# Patient Record
Sex: Female | Born: 1985 | Race: Black or African American | Hispanic: No | Marital: Single | Smoking: Former smoker
Health system: Southern US, Community
[De-identification: ages and names within clinical notes are randomized; demographics above are authoritative.]

## PROBLEM LIST (undated history)

## (undated) ENCOUNTER — Inpatient Hospital Stay (HOSPITAL_COMMUNITY): Payer: Self-pay

## (undated) DIAGNOSIS — N760 Acute vaginitis: Secondary | ICD-10-CM

## (undated) DIAGNOSIS — B999 Unspecified infectious disease: Secondary | ICD-10-CM

## (undated) DIAGNOSIS — N390 Urinary tract infection, site not specified: Secondary | ICD-10-CM

## (undated) DIAGNOSIS — A749 Chlamydial infection, unspecified: Secondary | ICD-10-CM

## (undated) DIAGNOSIS — B9689 Other specified bacterial agents as the cause of diseases classified elsewhere: Secondary | ICD-10-CM

## (undated) HISTORY — DX: Unspecified infectious disease: B99.9

## (undated) HISTORY — PX: INDUCED ABORTION: SHX677

---
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
## 2005-10-13 ENCOUNTER — Emergency Department (HOSPITAL_COMMUNITY): Admission: EM | Admit: 2005-10-13 | Discharge: 2005-10-13 | Payer: Self-pay | Admitting: Emergency Medicine

## 2007-02-10 ENCOUNTER — Inpatient Hospital Stay (HOSPITAL_COMMUNITY): Admission: AD | Admit: 2007-02-10 | Discharge: 2007-02-10 | Payer: Self-pay | Admitting: Gynecology

## 2007-02-26 ENCOUNTER — Inpatient Hospital Stay (HOSPITAL_COMMUNITY): Admission: AD | Admit: 2007-02-26 | Discharge: 2007-02-26 | Payer: Self-pay | Admitting: Obstetrics & Gynecology

## 2007-03-22 ENCOUNTER — Ambulatory Visit (HOSPITAL_COMMUNITY): Admission: RE | Admit: 2007-03-22 | Discharge: 2007-03-22 | Payer: Self-pay | Admitting: Family Medicine

## 2007-04-19 ENCOUNTER — Ambulatory Visit (HOSPITAL_COMMUNITY): Admission: RE | Admit: 2007-04-19 | Discharge: 2007-04-19 | Payer: Self-pay | Admitting: Family Medicine

## 2007-05-09 ENCOUNTER — Ambulatory Visit (HOSPITAL_COMMUNITY): Admission: RE | Admit: 2007-05-09 | Discharge: 2007-05-09 | Payer: Self-pay | Admitting: Obstetrics & Gynecology

## 2007-06-03 ENCOUNTER — Inpatient Hospital Stay (HOSPITAL_COMMUNITY): Admission: AD | Admit: 2007-06-03 | Discharge: 2007-06-04 | Payer: Self-pay | Admitting: Obstetrics

## 2007-08-13 ENCOUNTER — Inpatient Hospital Stay (HOSPITAL_COMMUNITY): Admission: AD | Admit: 2007-08-13 | Discharge: 2007-08-13 | Payer: Self-pay | Admitting: Obstetrics & Gynecology

## 2007-09-07 ENCOUNTER — Ambulatory Visit (HOSPITAL_COMMUNITY): Admission: RE | Admit: 2007-09-07 | Discharge: 2007-09-07 | Payer: Self-pay | Admitting: Obstetrics & Gynecology

## 2007-09-18 ENCOUNTER — Inpatient Hospital Stay (HOSPITAL_COMMUNITY): Admission: RE | Admit: 2007-09-18 | Discharge: 2007-09-20 | Payer: Self-pay | Admitting: Obstetrics & Gynecology

## 2007-11-21 IMAGING — US US OB NUCHAL TRANSLUCENCY 1ST GEST
1 series · 14 of 23 positions shown · non-contrast
Comparison: none

OBSTETRICAL ULTRASOUND:
 This ultrasound was performed in The [HOSPITAL], and the AS OB/GYN report will be stored to [REDACTED] PACS.

[Series 1: us ob nuchal translucency 1st gest · 14 of 23 slices shown]
[im 1/23]
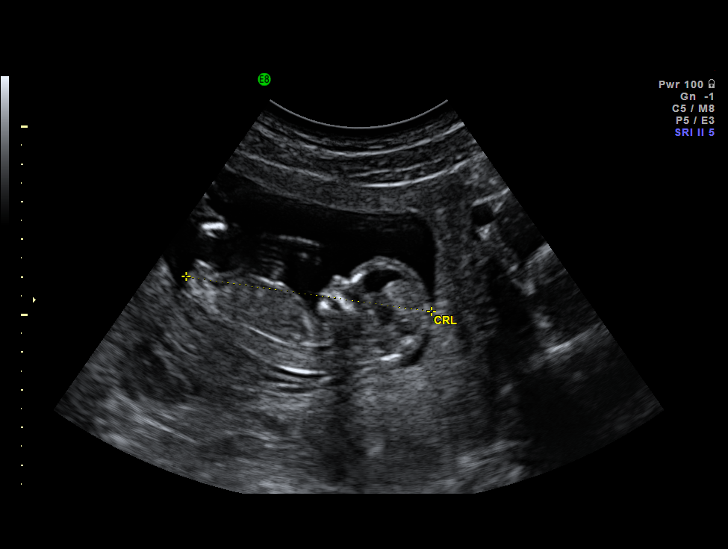
[im 3/23]
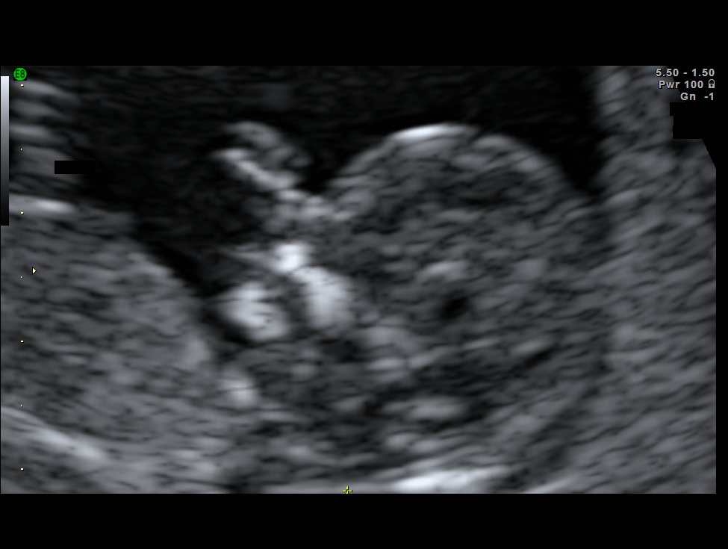
[im 5/23]
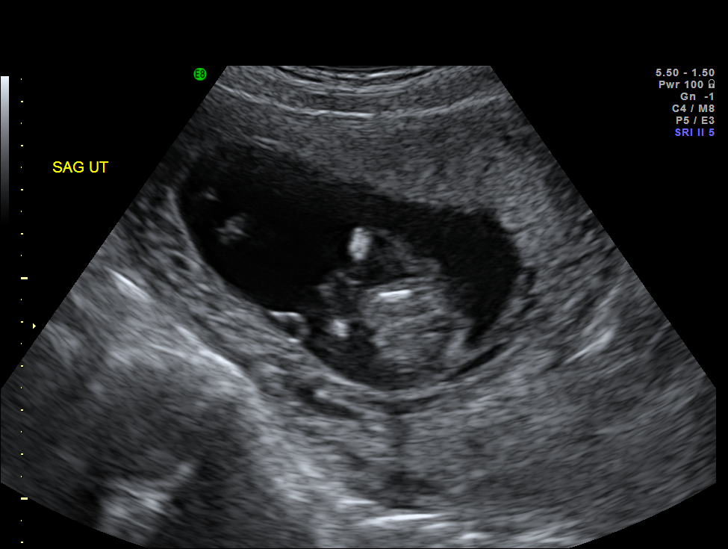
[im 6/23]
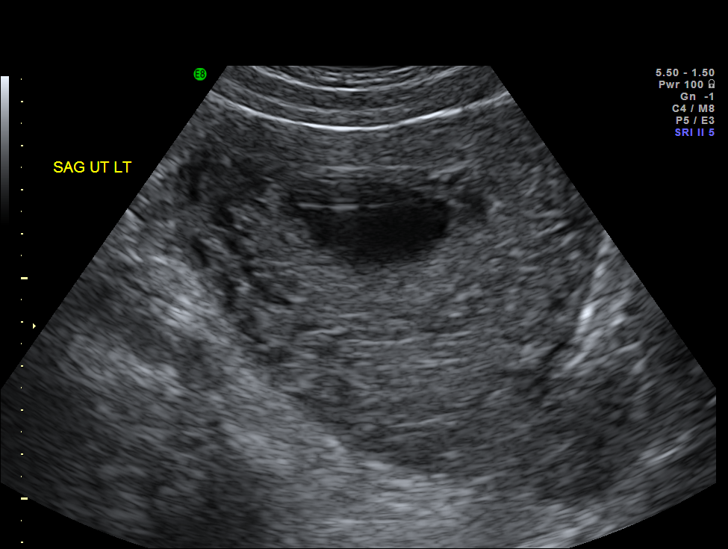
[im 8/23]
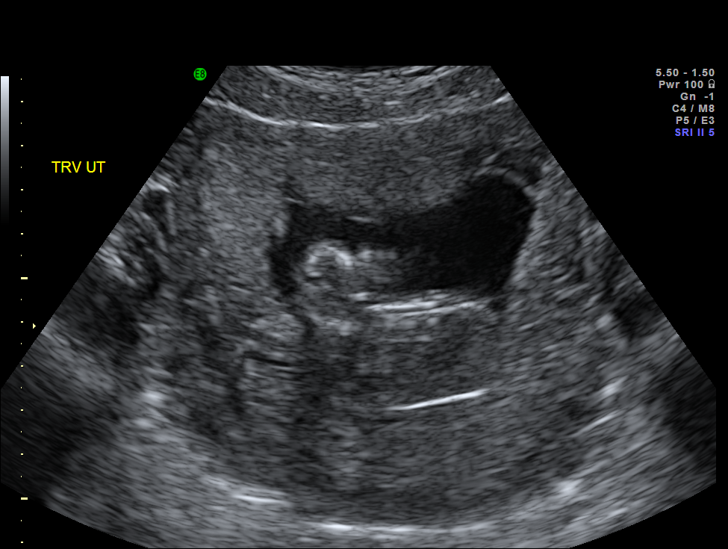
[im 10/23]
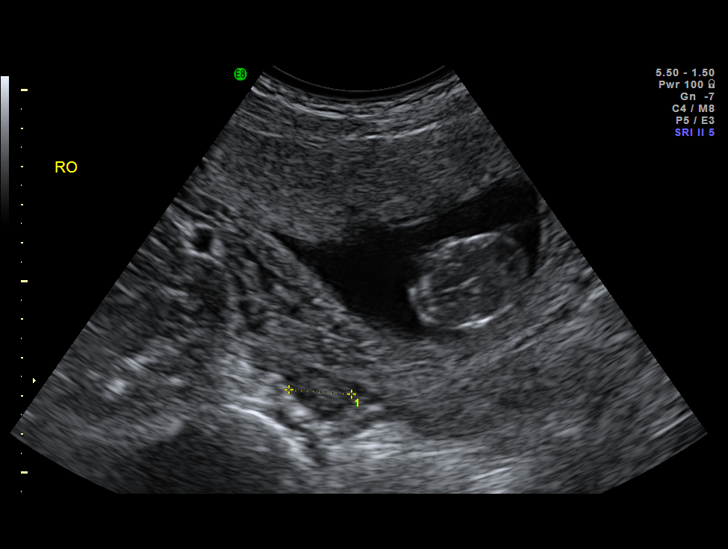
[im 11/23]
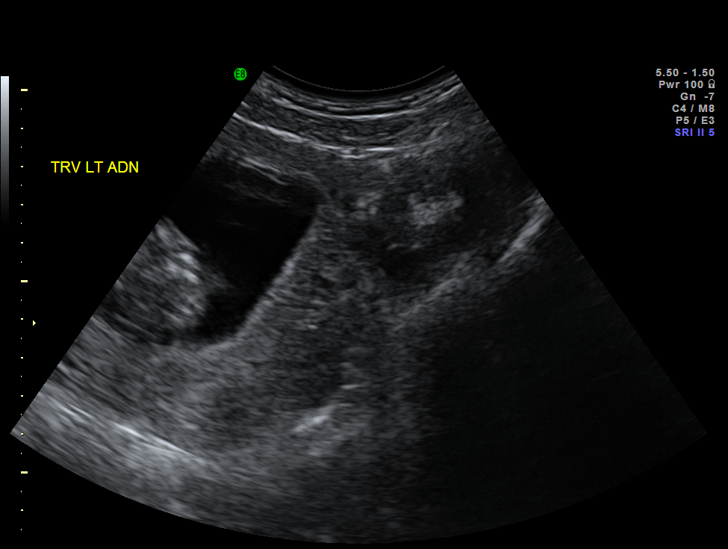
[im 13/23]
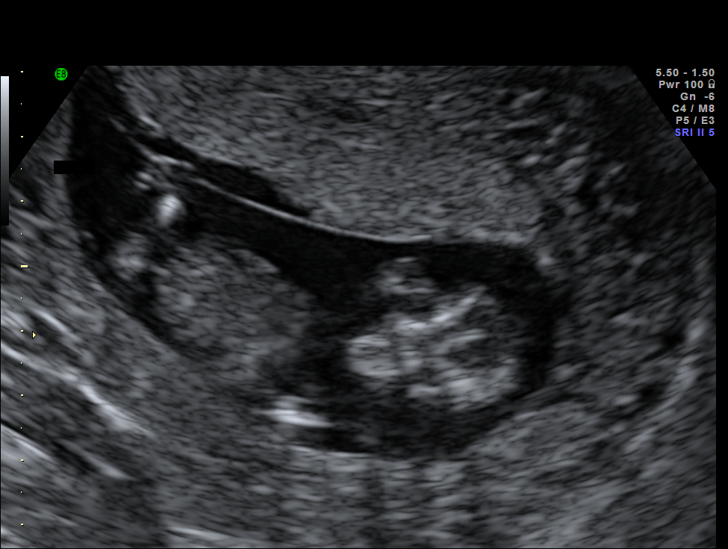
[im 14/23]
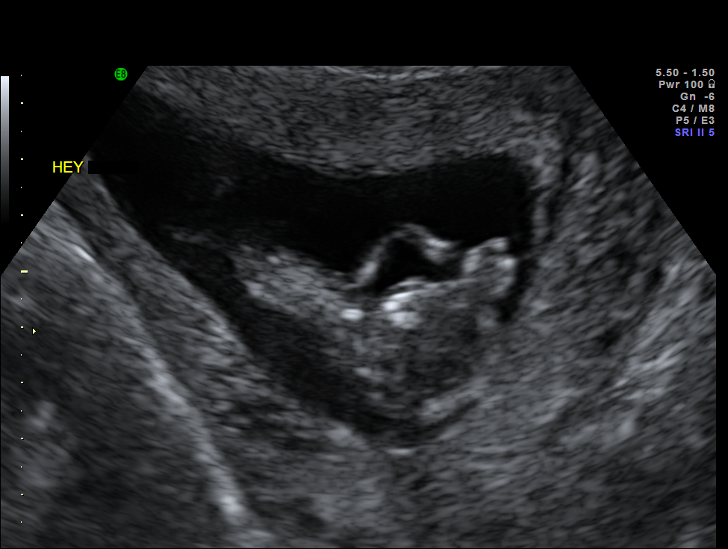
[im 16/23]
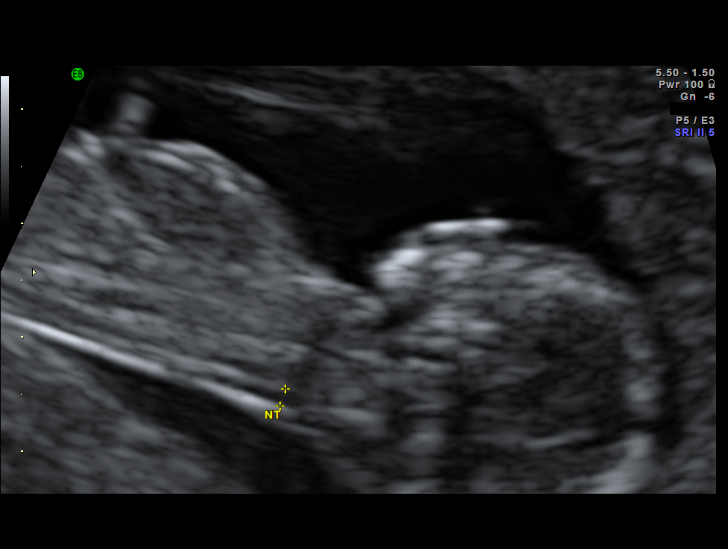
[im 18/23]
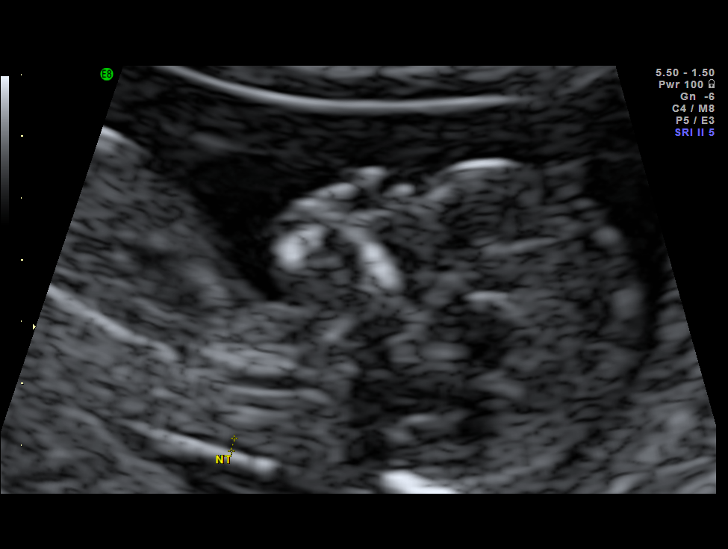
[im 19/23]
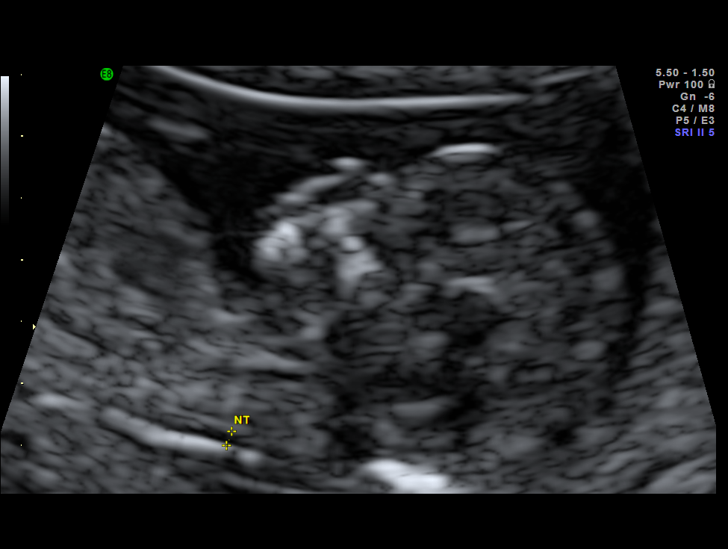
[im 21/23]
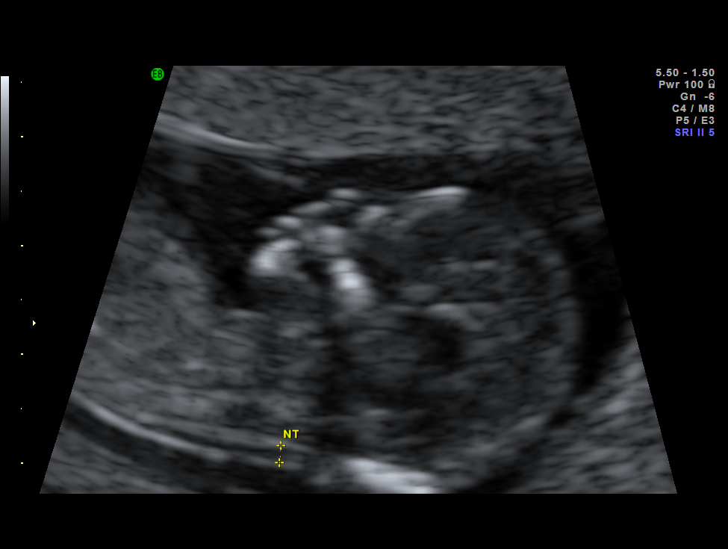
[im 23/23]
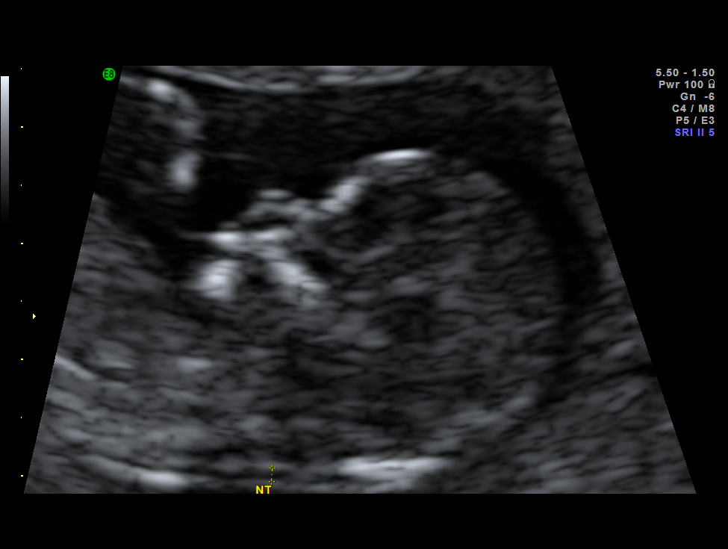

[14 of 23 positions shown; findings below may reference images not displayed]

IMPRESSION: The AS OB/GYN report has also been faxed to the ordering physician.

## 2008-05-08 IMAGING — US US FETAL BPP W/O NONSTRESS
1 series · 14 of 28 positions shown · non-contrast
Comparison: none

OBSTETRICAL ULTRASOUND:

 This ultrasound exam was performed in the [HOSPITAL] Ultrasound Department.  The OB US report was generated in the AS system, and faxed to the ordering physician.  This report is also available in [REDACTED] PACS.

[Series 1: us fetal bpp w/o nonstress · non-contrast · 29 acquisitions, 14 frames shown]
[im 2/29]
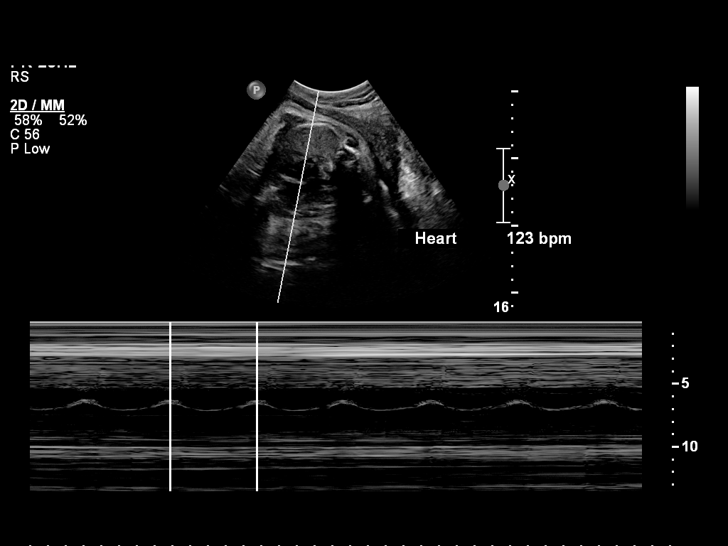
[im 4/29]
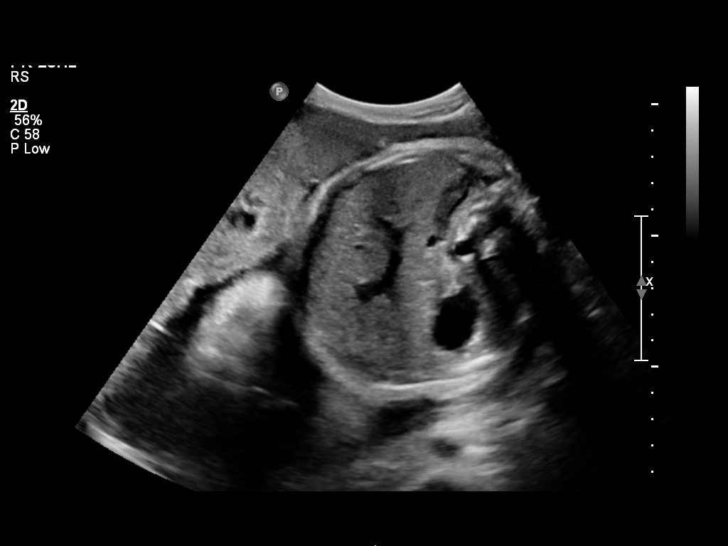
[im 6/29]
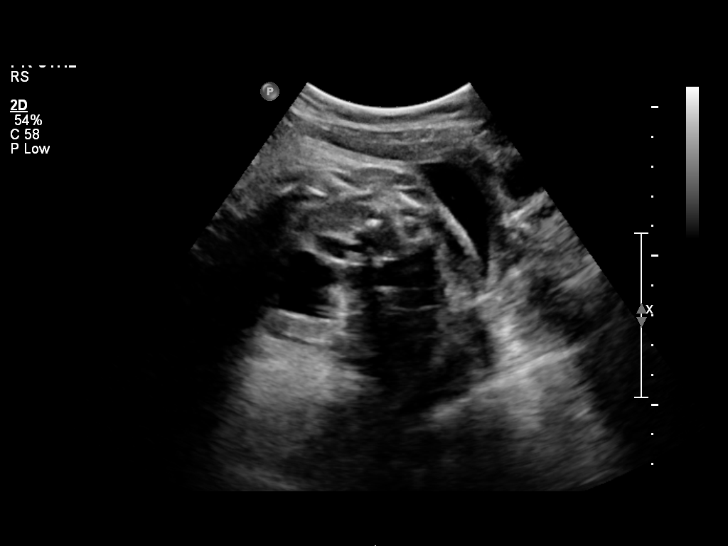
[im 8/29]
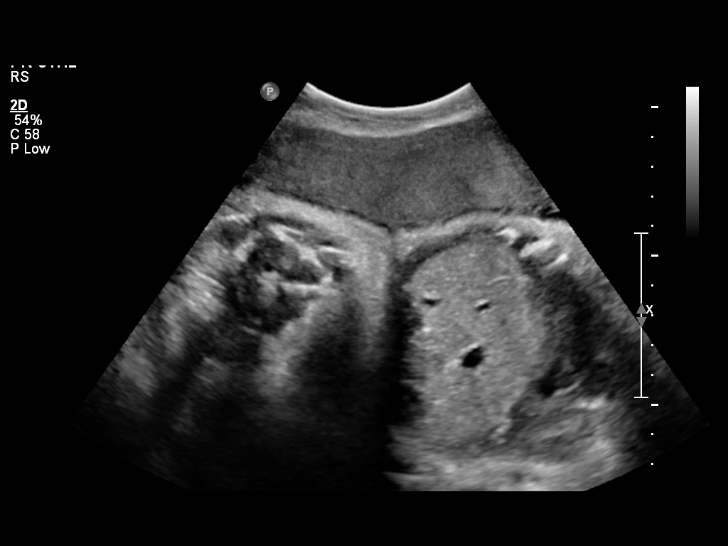
[im 10/29]
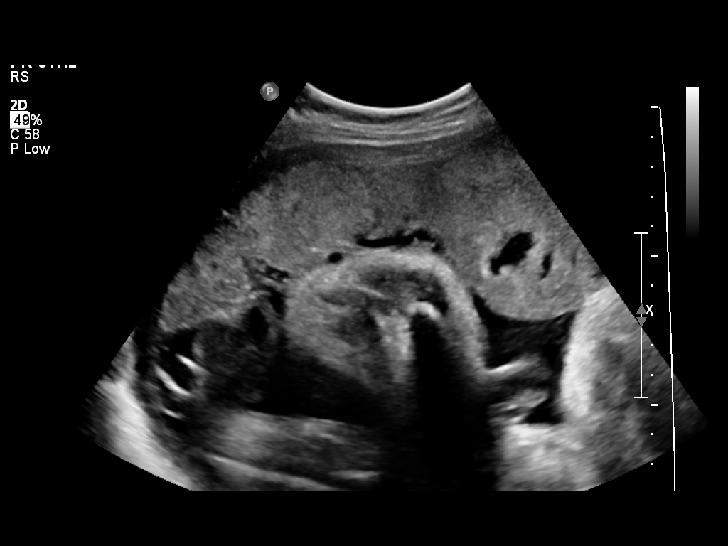
[im 12/29]
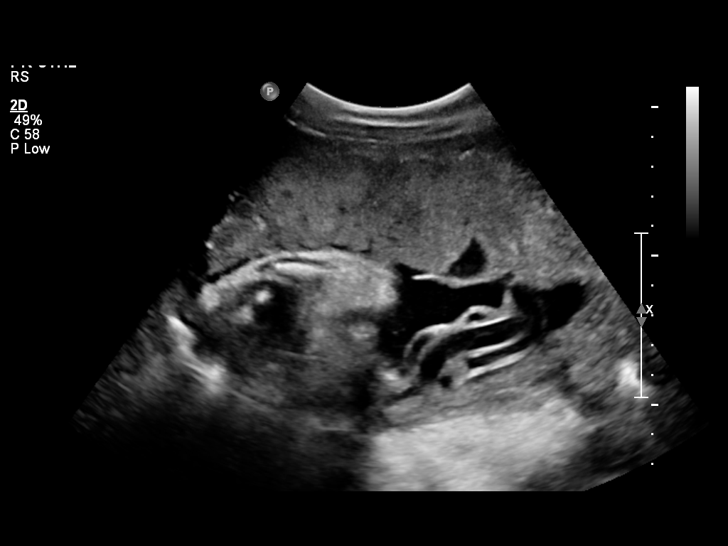
[im 14/29]
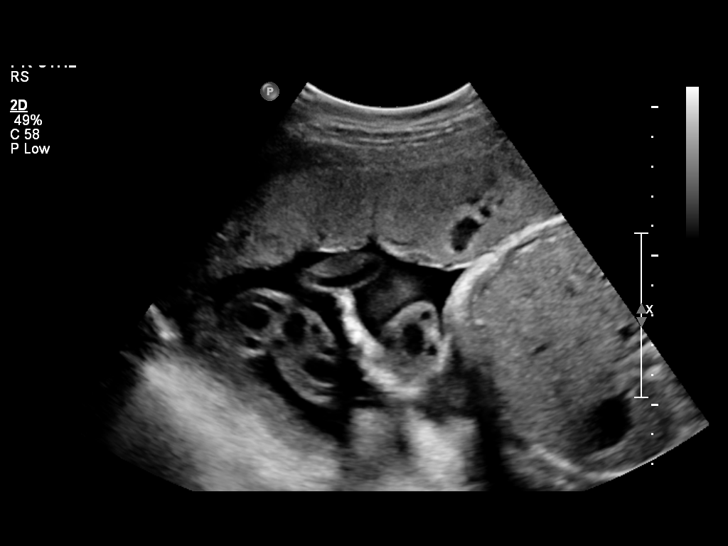
[im 16/29]
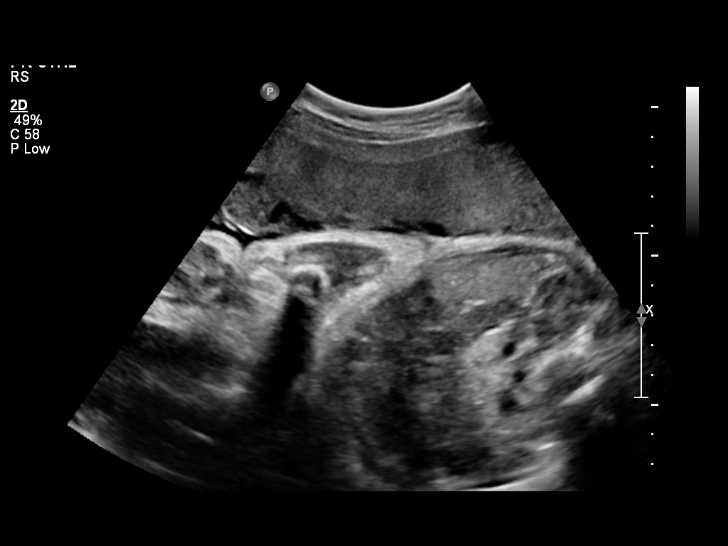
[im 18/29]
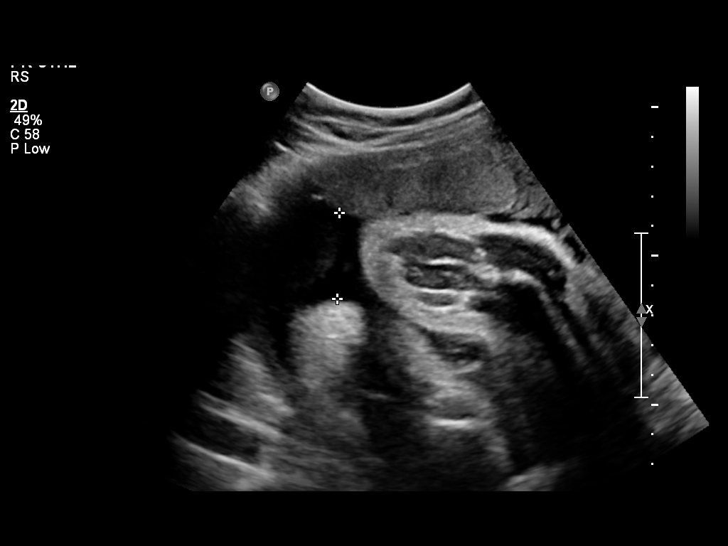
[im 20/29]
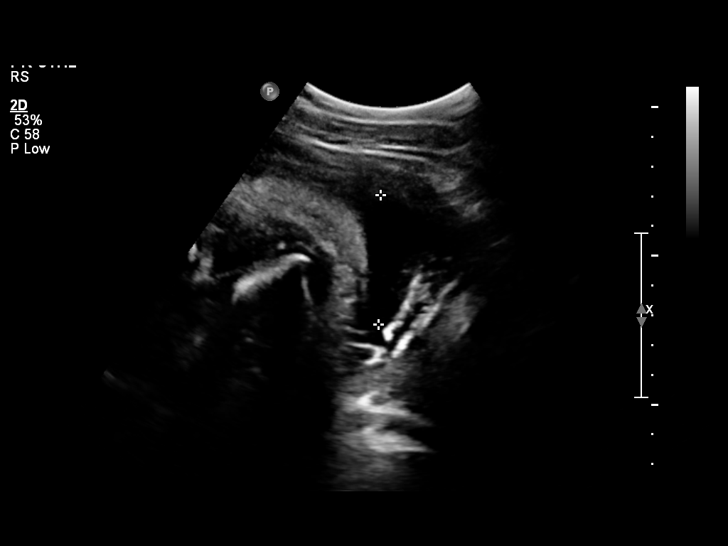
[im 22/29]
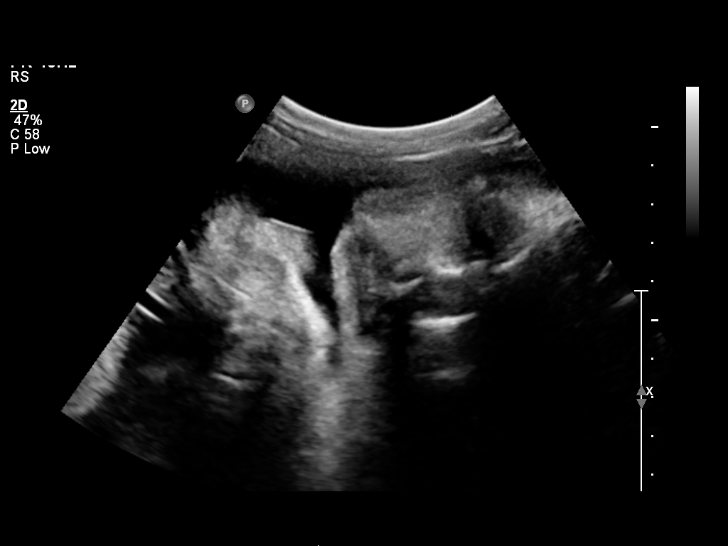
[im 24/29]
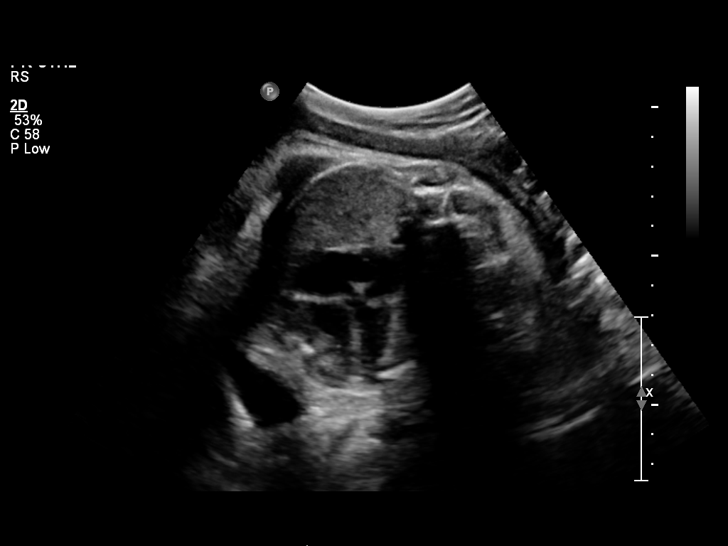
[im 26/29]
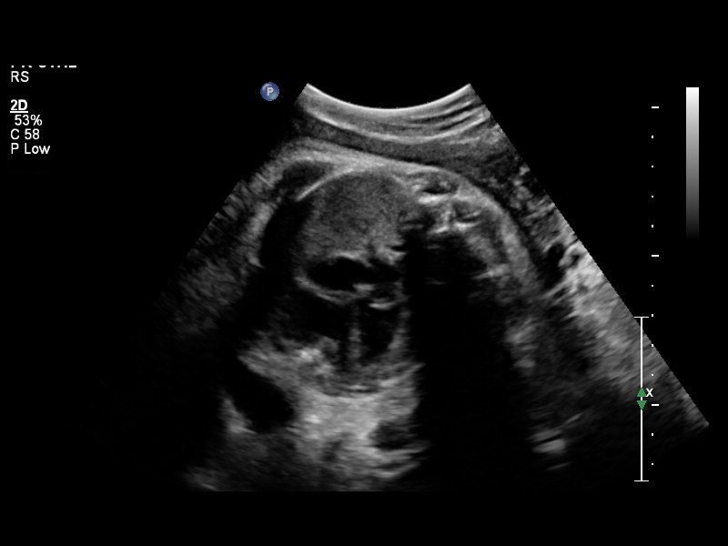
[im 29/29]
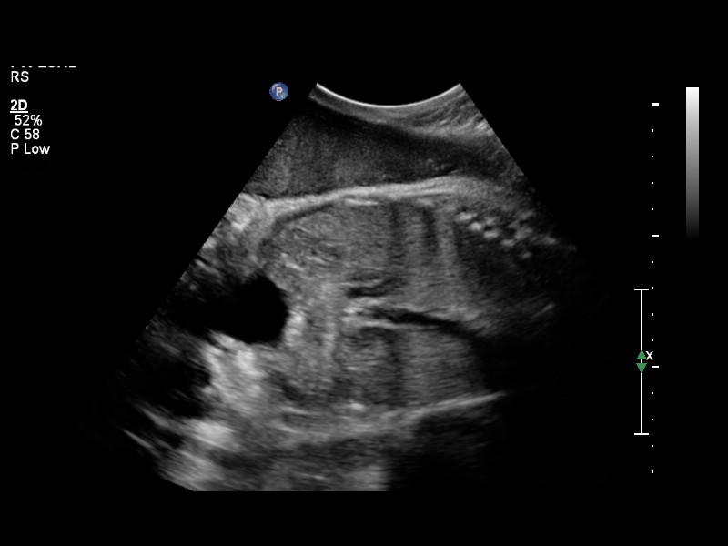

[14 of 28 positions shown; findings below may reference images not displayed]

IMPRESSION: See AS Obstetric US report.

## 2009-11-16 ENCOUNTER — Emergency Department (HOSPITAL_COMMUNITY): Admission: EM | Admit: 2009-11-16 | Discharge: 2009-11-16 | Payer: Self-pay | Admitting: Emergency Medicine

## 2010-07-27 ENCOUNTER — Encounter: Payer: Self-pay | Admitting: Obstetrics & Gynecology

## 2010-09-23 LAB — URINALYSIS, ROUTINE W REFLEX MICROSCOPIC
Bilirubin Urine: NEGATIVE
Hgb urine dipstick: NEGATIVE
Ketones, ur: 15 mg/dL — AB
Specific Gravity, Urine: 1.029 (ref 1.005–1.030)
Urobilinogen, UA: 2 mg/dL — ABNORMAL HIGH (ref 0.0–1.0)

## 2010-09-23 LAB — WET PREP, GENITAL

## 2010-09-23 LAB — GC/CHLAMYDIA PROBE AMP, GENITAL: Chlamydia, DNA Probe: NEGATIVE

## 2010-11-21 NOTE — H&P (Signed)
[facility]  Patient:    Tiffany Wilkinson, Tiffany Wilkinson                      MRN: [id number] Adm. Date:  [id number] Disc. Date: [id number] Attending:  Doug Sou Dictator:   Dianah Field, P.A.-C.                         History and Physical  DATE OF BIRTH:  August 21, 1985  CHIEF COMPLAINT:  Constipation for three weeks.  HISTORY OF PRESENT ILLNESS:  The patient is a nice, 25 year old, African-American female, who is followed by Dr. Barton Fanny at [facility].  She suffers from chronic constipation and normally has bowel movements about once a week.  However, for the last three weeks she has had only smears of stool and no significant bowel movements.  About a week ago she started taking various medications to try to get her bowels to move properly. These medications include Correctol, Milk of magnesia, magnesium citrate, and most recently lactulose which was prescribed by Dr. Luciana Axe as well as Fleet Phospho-Soda.  She used a Fleet enema about a week ago with no result.  She has had a x-rays with an acute abdominal series July 22.  This showed marked fecal impaction at the rectum with large amounts of stool seen throughout the remainder of the colon.  A single gas distended lupus bowel was seen in the left abdomen which likely represented the sigmoid colon.  There was no observation.  The patient has not seen any blood.  Her only regular medications is a Depo-Provera shot.  PAST MEDICAL HISTORY:  She was treated for a UTI in June of 2002.  Cultures grew out coagulase-negative staph.  She has a history of cervicitis with questionable ascending pelvic inflammatory disease for which she was admitted October of 2001 to [facility] and treated by Drs. Clearance Coots and Morada. She has a history of intrauterine pregnancy in October of 2001, for which she is status post therapeutic abortion.  She has a history of Chlamydial infection.  PAST SURGICAL HISTORY:   No prior surgeries.  ALLERGIES:  No known drug allergies.  SOCIAL HISTORY:  The patient lives with her grandmother in [place].  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  She will be a senior this coming school year.  She does not smoke and occasionally has a wine cooler but is not a heavy drinker.  She denies illicit drug use.  She is sexually active and she is monogamous.  She has two brothers.  Her mother lives in [place].  FAMILY HISTORY:  Paternal grandfather has prostate cancer but there is no history of GI problems, cancers, heart disease, hypertension, or diabetes.  CURRENT MEDICATIONS:  Depo-Provera shots for birth control.  REVIEW OF SYSTEMS:  Since she has been constipation these last few weeks her appetite has decreased.  She says she thinks she has lost maybe 2-3 pounds. Generally, she is not particularly fatigued.  GASTROINTESTINAL:  In addition to the constipation she has abdominal distress associated with cramping pain but no severe abdominal pain.  GENITOURINARY:  She does not have regular periods owing to the Depo-Provera shots but does occasionally  have spotting. She denies any suspicious vaginal discharge or pruritus.  Denies dysuria or foul-smelling urine.  CARDIOVASCULAR:  No chest pain, no palpitations. PULMONARY:  No cough or shortness of breath.  She has had her usual childhood  immunizations and is up-to-date on all of these.  PHYSICAL EXAMINATION:  VITAL SIGNS:  The patient is healthy and fit appearing, young, black female. Her weight is 139 pounds, height is 5 feet 7 inches, blood pressure 110/67, pulse 72, respirations 19, and temperature 98 degrees.  HEENT:  Sclerae is nonicteric.  Conjunctivae is pink.  Oropharynx:  Teeth are in good repair.  Oral mucosa is moist and clear.  NECK:  There is no masses or thyromegaly.  HEART:  There is a regular rate and rhythm.  No murmurs, rubs or gallops.  ABDOMEN:  Soft with no obvious  masses.  No hepatosplenomegaly.  No scars. Bowel sounds are hypoactive.  RECTAL:  Examination per Dr. Juanda Chance is positive for fecal impaction.  Stool is guaiac-negative.  The fecal impaction is quite large and reachable just at the fingertip.  EXTREMITIES:  No clubbing, cyanosis, or edema.  NEUROLOGICAL:  No tremor.  The patient is walking and standing without difficulty.  LABORATORY DATA:  None so far but a urinalysis will be ordered.  IMPRESSION: 1. Fecal impaction on top of chronic constipation.  This is probably a    functional problem.  Doubt any obstruction given a recent acute    abdominal series showing no obstruction. 2. Coagulase-negative staph in June of 2002, rule out recurrent urinary tract    infection. 3. History of cervicitis/question ascending pelvic inflammatory disease.    Hospitalized and treated in October of 2001. 4. Status post therapeutic abortion. 5. History of Chlamydial pelvic infection.  PLAN:  Admit and she is now on the ward in the pediatrics unit.  She is to have manual disimpaction, then tap water enemas and Dulcolax.  May possibly add a more aggressive laxative regimen such as Mag Citrate or GoLYTELY to this.  Will allow her clear liquids for now.  Will hold off on repeating KUB as she just had one just three days ago and it was unremarkable except for the impaction. DD:  01/27/01 TD:  01/28/01 Job: [id number]

## 2010-11-21 NOTE — Discharge Summary (Signed)
[place]  Patient:    Tiffany Wilkinson, Tiffany Wilkinson                      MRN: [id number] Adm. Date:  [id number] Disc. Date: [id number] Attending:  Mervin Hack Dictator:   Dianah Field, P.A. CC:         Hedwig Morton. Juanda Chance, M.D. [facility]  Barton Fanny, M.D.  at [facility]   Discharge Summary  ADMITTING DIAGNOSES: 1. Fecal impaction/obstipation. 2. Chronic constipation. 3. Coagulase negative Staphylococcus urinary tract infection June 2002. 4. History of cervicitis, questionable ascending pelvic inflammatory disease    treated with admission at [facility] October 2001.  Physicians    at that time were Dr. Bing Neighbors. Clearance Coots and Dr. Andrey Spearman. 5. History of intrauterine pregnancy October 2001, status post therapeutic    abortion. 6. History of chlamydial infection.  DISCHARGE DIAGNOSES: 1. Resolved fecal impaction. 2. Chronic constipation to be treated as outpatient with MiraLax.  BRIEF HISTORY:  [name] is a 25 year old African-American female with above-noted past medical history.  She has chronic constipation and normally bowel movements occur about once weekly.  For the past three weeks, she has had only small volume of stool and no significant bowel movements.  About a week ago, she started trying many different laxatives and enemas in order to get her bowels moving but she had no success.  On January 24, 2001, an acute abdominal series showed fecal impaction at the rectum and large amounts of stool throughout the remainder of the colon.  A single distended loop of bowel was probably the sigmoid colon.  The patient was quite uncomfortable with her abdominal distention and cramping pain; however, she denied any severe abdominal pain or bleeding per rectum.  Beside the laxatives, which she had used recently, her only regular medication is a Depo-Provera shot monthly.  Dr. Hedwig Morton. Juanda Chance had evaluated her in the office on  referral from [facility] and decided to admit her for disimpaction and further workup as necessitated.  CONSULTANTS:  None.  PROCEDURES:  None.  HOSPITAL COURSE:  The patient was admitted to the pediatrics unit.  For the constipation, she received Dulcolax tablets and large volume tap water enema. With these measures, the patients stools began to move and the results were several bowel movements of large volume.  A repeat KUB the following morning following the disimpaction and multiple stools showed resolution of the impaction.  The patient felt much better.  She was discharged to home in stable condition by Dr. Hedwig Morton. [place].  DISCHARGE INSTRUCTIONS:  She was to follow up with Dr. Barton Fanny at [facility].  DISCHARGE MEDICATIONS: 1. MiraLax 17 gm mixed with fluid daily or at least every other day. 2. Depo-Provera every month as before.  ACTIVITY:  She was okay to resume all activities.  DIET:  Diet was to be that of a high fiber diet and to include lots of fluids. D:  02/18/01 TD:  02/19/01 Job: [id number]

## 2010-11-21 NOTE — Discharge Summary (Signed)
[facility]  Patient:    Tiffany Wilkinson, Tiffany Wilkinson                      MRN: [id number] Adm. Date:  [id number] Disc. Date: [id number] Attending:  Tammi Sou Dictator:   Andrey Spearman, M.D.                           Discharge Summary  DISCHARGE DIAGNOSES:          1. Cervicitis versus questionable ascending                                  pelvic inflammatory disease.                               2. Possible urinary tract infection.                               3. Intrauterine pregnancy at approximately                                  [redacted] weeks gestation by ultrasound with no fetal                                  pole identified.  DISCHARGE MEDICATIONS:        Augmentin 875 mg p.o. b.i.d. x 7 days.  BRIEF ADMISSION HISTORY AND PHYSICAL:                 This patient is a 25 year old, G1, P0, who initially reported to the [facility] Emergency Room complaining of vaginal itching of approximately one week and questioned whether or not she was pregnant.  The patient also reported low back pain on admission.  She reported that her last known period was February 18, 2000.  The patient reported that she had had chlamydia in the past and that her current sexual partner was also in the emergency room for treatment of a urethral discharge and burning with urination.  PHYSICAL EXAMINATION:         Afebrile.  GENERAL APPEARANCE:           In no acute distress.  ABDOMEN:                      Left lower quadrant tenderness without rebound.  PELVIC:                       Mild cervical motion tenderness.  LABORATORY DATA:              Positive pregnancy test.  The UA showed 21-50 white blood cells, many bacteria, and moderate leukocyte esterase.  The patients white count was 14.6 on admission.  HOSPITAL COURSE: #1 - CERVICITIS VERSUS POSSIBLE ASCENDING PELVIC INFLAMMATORY DISEASE:  The patient was admitted and started on gentamicin  and clindamycin and monitored closely.  The patient remained afebrile throughout the admission and maintained good p.o. intake.  The patients cultures were still pending upon discharge, however, the patient received almost 48 hours of gentamicin and clindamycin.  She was deemed ready for discharge home.  She will be discharged home on Augmentin 875 mg p.o. b.i.d. x 7 days.  #2 - INTRAUTERINE PREGNANCY AT APPROXIMATELY 6 WEEKS:  The patient had an ultrasound on admission which showed a gestational sac of approximately 6 weeks size, however, a fetal pole was not delineated.  Therefore, it is difficult to exclude a possible fetal demise at this point.  This was discussed with the patient.  Right now the patient has not explained her pregnancy status with her family.  She will need to have a follow-up ultrasound in one week, which will be scheduled and the patient will be called with the appointment.  #3 - POSSIBLE URINARY TRACT INFECTION:  The patients initial UA showed evidence of a probable urinary tract infection.  Again, the culture was not available on discharge.  However, the patient was again treated with gentamicin and clindamycin for almost 48 hours and will be discharged home with Augmentin.  The patient will follow up in the maternity admissions unit for an ultrasound in one week and then if the patient is indeed pregnant, follow-up prenatal care will be established. DD:  04/09/00 TD:  04/10/00 Job: [id number]

## 2010-11-21 NOTE — Discharge Summary (Signed)
[place]  Patient:    Tiffany Wilkinson, Tiffany Wilkinson                      MRN: [id number] Adm. Date:  [id number] Disc. Date: [id number] Attending:  Mervin Hack Dictator:   Dianah Field, P.A. CC:         Tiffany Wilkinson, M.D., Noel Journey. Juanda Chance, M.D. [facility]   Discharge Summary  ADMISSION DIAGNOSES: 1. Fecal impaction/obstipation. 2. Chronic constipation. 3. Coagulase-negative Staphylococcus urinary tract infection in June of 2002. 4. History of cervicitis with questionable ascending pelvic inflammatory    disease treated with admission at [facility] in October of 2001.    Physicians were Leonette Most A. Clearance Coots, M.D., and Andrey Spearman 5. History of intrauterine pregnancy in October of 2001, status post    therapeutic abortion. 6. History of chlamydial infection.  DISCHARGE DIAGNOSES: 1. Fecal impaction/obstipation, resolved. 2. Chronic constipation.  CONSULTATIONS:  None.  PROCEDURES:  None.  BRIEF HISTORY:  Tiffany Wilkinson is a pleasant, 25 year old, African-American female.  She was referred by Tiffany Wilkinson, M.D., to [facility].  She does not have a previous GI doctor.  The patient generally has chronic constipation and moves her bowels just about once weekly.  She does not use laxatives generally.  For the last three weeks prior to admission, she developed worsening of the constipation and had had only small liquid bowel movements that were of insignificant volumes over that period of three weeks.  About a week ago, she started trying to use over-the-counter medications, as well as enemas to try to get her bowels moving.  However, again she only had scant results.  Tiffany Wilkinson, M.D., prescribed her Lactulose.  She also got an acute abdominal series on January 24, 2001, and this showed a fecal impaction at the rectum with large amounts of stool within the remainder of the colon.  A distended loop of bowel was seen in the left  abdomen consistent with the sigmoid colon.  The patient had not had any blood per rectum.  She does not use any medications on a regular basis other than getting her regular Depo-Provera shots.  She was treated for the urinary tract infection in June of 2002, but denies any dysuria, frequency, or other symptoms suggestive of UTI.  She denied abdominal pain other than that it was getting uncomfortable because of the prolonged constipation.  She denied rectal pain.  Barbette Hair. Arlyce Dice, M.D., had been contacted at the [facility] office on January 28, 2001, and made some suggestions as to further medical therapy, but it is not clear what these were.  In any event, these did not result in any significant change in the constipation, so she went to see Hedwig Morton. Juanda Chance, M.D., in the office on January 29, 2001.  Dr. Juanda Chance found a large fecal impaction on fecal exam and chose to admit the patient for disimpaction.  She was admitted to the pediatrics unit at [facility].  LABORATORY DATA:  None.  KUB of January 30, 2001, showed resolution of the fecal impaction.  HOSPITAL COURSE:  The patient was admitted to 45.  First off the nurses manually disimpacted her.  She also received a couple of Dulcolax tablets. This resulted in a large amount of stool forthcoming.  She had multiple stools.  The follow-up KUB the following morning did show resolution of the impaction.  DISPOSITION:  The patient was discharged to home that morning.  Her bowel regimen was to be that of MiraLax in the future, as well as high-fiber diet and lots of liquids, including water and juices.  FOLLOW-UP:  She was to follow up with Tiffany Wilkinson, M.D., in four to six weeks.  There was no plan for GI follow-up.  MEDICATIONS AT DISCHARGE: 1. Depo-Provera shots as before. 2. MiraLax 17 g, which equals one capful, once daily or every other day. DD:  01/28/01 TD:  01/29/01 Job: [id number]

## 2011-01-19 ENCOUNTER — Inpatient Hospital Stay (HOSPITAL_COMMUNITY)
Admission: AD | Admit: 2011-01-19 | Discharge: 2011-01-19 | Disposition: A | Discharge status: Home / Self Care | Payer: Medicaid Other | Source: Ambulatory Visit | Attending: Obstetrics and Gynecology | Admitting: Obstetrics and Gynecology

## 2011-01-19 ENCOUNTER — Encounter (HOSPITAL_COMMUNITY): Payer: Self-pay | Admitting: *Deleted

## 2011-01-19 DIAGNOSIS — N949 Unspecified condition associated with female genital organs and menstrual cycle: Secondary | ICD-10-CM | POA: Insufficient documentation

## 2011-01-19 DIAGNOSIS — A599 Trichomoniasis, unspecified: Secondary | ICD-10-CM

## 2011-01-19 DIAGNOSIS — N912 Amenorrhea, unspecified: Secondary | ICD-10-CM | POA: Insufficient documentation

## 2011-01-19 LAB — URINALYSIS, ROUTINE W REFLEX MICROSCOPIC
Bilirubin Urine: NEGATIVE
Hgb urine dipstick: NEGATIVE
Protein, ur: NEGATIVE mg/dL
Urobilinogen, UA: 2 mg/dL — ABNORMAL HIGH (ref 0.0–1.0)

## 2011-01-19 LAB — WET PREP, GENITAL

## 2011-01-19 MED ORDER — METRONIDAZOLE 500 MG PO TABS: 500.0000 mg | ORAL_TABLET | Freq: Two times a day (BID) | ORAL | Status: AC

## 2011-01-19 NOTE — ED Provider Notes (Addendum)
History   The patient states that she has not had a period since she started depo provera 2 years ago. She did not go for her last injection that was due 4 months ago. Also c/o lesion in vaginal area that has been there for 2 months. Sometimes it seems better but then starts itching and some burning when urinating.   Chief Complaint  Patient presents with  . Amenorrhea   The history is provided by the patient.    No past medical history on file.  No past surgical history on file.  No family history on file.  History  Substance Use Topics  . Smoking status: Not on file  . Smokeless tobacco: Not on file  . Alcohol Use: Not on file    OB History    Grav Para Term Preterm Abortions TAB SAB Ect Mult Living   2 1 1  1 1    1       Review of Systems  Constitutional: Negative for fever, chills and diaphoresis.  HENT: Negative for ear pain, congestion, sore throat, facial swelling, neck pain, neck stiffness, dental problem and sinus pressure.   Eyes: Negative.   Respiratory: Negative for cough, chest tightness and wheezing.   Gastrointestinal: Negative for nausea, vomiting, abdominal pain, diarrhea, constipation and abdominal distention.  Genitourinary: Positive for dysuria and genital sores. Negative for frequency, flank pain, vaginal bleeding and difficulty urinating.       Amenorrhea  Musculoskeletal: Negative for myalgias, back pain and gait problem.  Skin: Negative for color change and rash.  Neurological: Negative for dizziness, weakness and headaches.    Physical Exam  BP 148/60  Pulse 72  Temp(Src) 98.5 F (36.9 C) (Oral)  Resp 16  Ht 5\' 8"  (1.727 m)  Wt 173 lb 9.6 oz (78.744 kg)  BMI 26.40 kg/m2  SpO2 98%  Physical Exam  Nursing note and vitals reviewed. Constitutional: She is oriented to person, place, and time. She appears well-developed and well-nourished.  HENT:  Head: Normocephalic.  Eyes: EOM are normal.  Neck: Neck supple.  Pulmonary/Chest: Effort  normal.  Abdominal: Soft. There is no tenderness.  Genitourinary: Uterus normal.    There is lesion on the left labia. Vaginal discharge found.  Musculoskeletal: Normal range of motion.  Neurological: She is alert and oriented to person, place, and time. No cranial nerve deficit.  Skin: Skin is warm and dry.    ED Course  Procedures  MDM Cultures for GC, Chlamydia and HSV sent, RPR drawn. Will have pt. Follow up with GYN clinic.     Agree with above Stillman Buenger A

## 2011-01-19 NOTE — Progress Notes (Signed)
Pt presents to [facility] with c/o of no period for 4 month with breast tenderness. She is also c/o vaginal irritation X 1 month with white vaginal discharge and itching. She reports using monistat which caused burning."

## 2011-01-19 NOTE — ED Provider Notes (Addendum)
Urine pregnancy test is negative and urine is normal.  Agree with note by Kerrie Buffalo, NP  [facility]

## 2011-01-19 NOTE — Progress Notes (Signed)
Pt states she last took Depo 7 months ago and has not yet had a period. Has had off and on cramping like she should start her period but it does not come. Has been having low back pain for about 2 weeks. Has a thick white vaginal d/c with no odor.

## 2011-01-19 NOTE — Progress Notes (Signed)
Called, pt is not in lobby

## 2011-01-21 LAB — HERPES SIMPLEX VIRUS CULTURE: Special Requests: NORMAL

## 2011-03-27 LAB — URINALYSIS, ROUTINE W REFLEX MICROSCOPIC
Bilirubin Urine: NEGATIVE
Glucose, UA: NEGATIVE
Ketones, ur: NEGATIVE
pH: 6

## 2011-03-30 LAB — CBC
HCT: 36.4
Hemoglobin: 12.6
MCHC: 34.6
MCHC: 34.8
MCV: 91.2
Platelets: 261
RBC: 3.54 — ABNORMAL LOW
RDW: 12.8

## 2011-04-14 LAB — WET PREP, GENITAL

## 2011-04-14 LAB — URINALYSIS, ROUTINE W REFLEX MICROSCOPIC
Bilirubin Urine: NEGATIVE
Glucose, UA: NEGATIVE
Ketones, ur: NEGATIVE
Protein, ur: NEGATIVE

## 2011-04-16 ENCOUNTER — Emergency Department (HOSPITAL_BASED_OUTPATIENT_CLINIC_OR_DEPARTMENT_OTHER)
Admission: EM | Admit: 2011-04-16 | Discharge: 2011-04-16 | Disposition: A | Discharge status: Home / Self Care | Payer: Medicaid Other | Attending: Emergency Medicine | Admitting: Emergency Medicine

## 2011-04-16 ENCOUNTER — Encounter (HOSPITAL_BASED_OUTPATIENT_CLINIC_OR_DEPARTMENT_OTHER): Payer: Self-pay | Admitting: *Deleted

## 2011-04-16 DIAGNOSIS — A499 Bacterial infection, unspecified: Secondary | ICD-10-CM | POA: Insufficient documentation

## 2011-04-16 DIAGNOSIS — B9689 Other specified bacterial agents as the cause of diseases classified elsewhere: Secondary | ICD-10-CM | POA: Insufficient documentation

## 2011-04-16 DIAGNOSIS — N760 Acute vaginitis: Secondary | ICD-10-CM | POA: Insufficient documentation

## 2011-04-16 DIAGNOSIS — F17200 Nicotine dependence, unspecified, uncomplicated: Secondary | ICD-10-CM | POA: Insufficient documentation

## 2011-04-16 DIAGNOSIS — R109 Unspecified abdominal pain: Secondary | ICD-10-CM | POA: Insufficient documentation

## 2011-04-16 LAB — URINALYSIS, ROUTINE W REFLEX MICROSCOPIC
Bilirubin Urine: NEGATIVE
Leukocytes, UA: NEGATIVE
Nitrite: NEGATIVE
Specific Gravity, Urine: 1.027 (ref 1.005–1.030)
pH: 6 (ref 5.0–8.0)

## 2011-04-16 LAB — WET PREP, GENITAL: Trich, Wet Prep: NONE SEEN

## 2011-04-16 LAB — PREGNANCY, URINE: Preg Test, Ur: NEGATIVE

## 2011-04-16 MED ORDER — METRONIDAZOLE 500 MG PO TABS: 500.0000 mg | ORAL_TABLET | Freq: Two times a day (BID) | ORAL | Status: AC

## 2011-04-16 NOTE — ED Notes (Signed)
Vaginal discharge and lower abdominal pain for 4-5 days.

## 2011-04-16 NOTE — ED Provider Notes (Signed)
History     CSN: [id number] Arrival date & time: 04/16/2011 11:52 AM  Chief Complaint  Patient presents with  . Abdominal Pain  . Vaginal Discharge     HPI 25 year old female previously healthy presents with vaginal discharge. Patient complaining of burning white discharge x5 days. She had fleeting lower abdominal pain yesterday which is resolved. Patient states that she said abdominal pain in triage to avoid the embarrassment of having vaginal discharge her chief complaint. She denies back pain. She denies hematuria, frequency, urgency to spasm. No dysuria. She denies fever, chills. She has a remote history of trichomonas which was treated. She is sexually active uses protection.  History reviewed. No pertinent past medical history.  History reviewed. No pertinent past surgical history.  No family history on file.  History  Substance Use Topics  . Smoking status: Current Some Day Smoker  . Smokeless tobacco: Not on file  . Alcohol Use: Yes    OB History    Grav Para Term Preterm Abortions TAB SAB Ect Mult Living   2 1 1  1 1    1       Review of Systems Negative except as noted in history of present illness Allergies  Review of patient's allergies indicates no known allergies.  Home Medications   Current Outpatient Rx  Name Route Sig Dispense Refill  . IBUPROFEN 800 MG PO TABS Oral Take 800 mg by mouth every 8 (eight) hours as needed.      Marland Kitchen METRONIDAZOLE 500 MG PO TABS Oral Take 1 tablet (500 mg total) by mouth 2 (two) times daily. 14 tablet 0    BP 120/82  Pulse 84  Temp 98.8 F (37.1 C)  Resp 16  SpO2 100%  LMP 03/20/2011  Physical Exam  Nursing note and vitals reviewed. Constitutional: She is oriented to person, place, and time. She appears well-developed.  HENT:  Head: Atraumatic.  Mouth/Throat: Oropharynx is clear and moist.  Eyes: Conjunctivae and EOM are normal. Pupils are equal, round, and reactive to light.  Neck: Normal range of motion. Neck  supple.  Cardiovascular: Normal rate, regular rhythm, normal heart sounds and intact distal pulses.   Pulmonary/Chest: Effort normal and breath sounds normal. No respiratory distress. She has no wheezes. She has no rales.  Abdominal: Soft. She exhibits no distension. There is no tenderness. There is no rebound and no guarding.  Genitourinary: Vaginal discharge found.       Cervix normal-appearing no cervical motion tenderness  Musculoskeletal: Normal range of motion.  Neurological: She is alert and oriented to person, place, and time.  Skin: Skin is warm and dry. No rash noted.  Psychiatric: She has a normal mood and affect.    ED Course  Procedures (including critical care time)  Labs Reviewed  WET PREP, GENITAL - Abnormal; Notable for the following:    Clue Cells, Wet Prep FEW (*)    WBC, Wet Prep HPF POC MODERATE (*)    All other components within normal limits  URINALYSIS, ROUTINE W REFLEX MICROSCOPIC  PREGNANCY, URINE  GC/CHLAMYDIA PROBE AMP, GENITAL   No results found.   1. Bacterial vaginosis       MDM  Vaginal discharge secondary to bacterial vaginosis. Her abdomen is benign. Will treat with Flagyl. GC and Chlamydia pending.  Stefano Gaul, MD         Forbes Cellar, MD 04/16/11 1300

## 2011-04-17 LAB — WET PREP, GENITAL
Clue Cells Wet Prep HPF POC: NONE SEEN
Trich, Wet Prep: NONE SEEN

## 2011-04-20 LAB — CBC
HCT: 36.1
MCV: 90.6
Platelets: 306
RDW: 12.2
WBC: 12.9 — ABNORMAL HIGH

## 2011-04-20 LAB — URINALYSIS, ROUTINE W REFLEX MICROSCOPIC
Ketones, ur: NEGATIVE
Nitrite: NEGATIVE
Specific Gravity, Urine: 1.015
Urobilinogen, UA: 0.2
pH: 6.5

## 2011-04-20 LAB — WET PREP, GENITAL

## 2011-04-20 LAB — HCG, QUANTITATIVE, PREGNANCY: hCG, Beta Chain, Quant, S: 57888 — ABNORMAL HIGH

## 2011-04-20 LAB — GC/CHLAMYDIA PROBE AMP, GENITAL: Chlamydia, DNA Probe: NEGATIVE

## 2011-06-11 ENCOUNTER — Encounter (HOSPITAL_BASED_OUTPATIENT_CLINIC_OR_DEPARTMENT_OTHER): Payer: Self-pay | Admitting: *Deleted

## 2011-06-11 ENCOUNTER — Emergency Department (HOSPITAL_BASED_OUTPATIENT_CLINIC_OR_DEPARTMENT_OTHER)
Admission: EM | Admit: 2011-06-11 | Discharge: 2011-06-11 | Disposition: A | Discharge status: Home / Self Care | Payer: Medicaid Other | Attending: Emergency Medicine | Admitting: Emergency Medicine

## 2011-06-11 DIAGNOSIS — R109 Unspecified abdominal pain: Secondary | ICD-10-CM | POA: Insufficient documentation

## 2011-06-11 DIAGNOSIS — A499 Bacterial infection, unspecified: Secondary | ICD-10-CM | POA: Insufficient documentation

## 2011-06-11 DIAGNOSIS — B9689 Other specified bacterial agents as the cause of diseases classified elsewhere: Secondary | ICD-10-CM | POA: Insufficient documentation

## 2011-06-11 DIAGNOSIS — N760 Acute vaginitis: Secondary | ICD-10-CM | POA: Insufficient documentation

## 2011-06-11 LAB — PREGNANCY, URINE: Preg Test, Ur: NEGATIVE

## 2011-06-11 LAB — URINALYSIS, ROUTINE W REFLEX MICROSCOPIC
Bilirubin Urine: NEGATIVE
Glucose, UA: NEGATIVE mg/dL
Hgb urine dipstick: NEGATIVE
Ketones, ur: NEGATIVE mg/dL
Leukocytes, UA: NEGATIVE
Nitrite: NEGATIVE
Protein, ur: NEGATIVE mg/dL
Specific Gravity, Urine: 1.03 (ref 1.005–1.030)
Urobilinogen, UA: 0.2 mg/dL (ref 0.0–1.0)
pH: 6 (ref 5.0–8.0)

## 2011-06-11 LAB — WET PREP, GENITAL

## 2011-06-11 MED ORDER — METRONIDAZOLE 500 MG PO TABS: 500.0000 mg | ORAL_TABLET | Freq: Two times a day (BID) | ORAL | Status: AC

## 2011-06-11 NOTE — ED Notes (Signed)
assisted with pelvic exam and Pt. Tolerated well

## 2011-06-11 NOTE — ED Provider Notes (Signed)
Medical screening examination/treatment/procedure(s) were performed by non-physician practitioner and as supervising physician I was immediately available for consultation/collaboration.  Ethelda Chick, MD 06/11/11 2002

## 2011-06-11 NOTE — ED Notes (Signed)
Lower abd pain x 3 days. Sharp and intermittent pain. Denies vomiting or diarrhea. Bilateral lower quad pain. Denies dysuria.

## 2011-06-11 NOTE — ED Provider Notes (Signed)
History     CSN: [id number] Arrival date & time: 06/11/2011  6:40 PM   First MD Initiated Contact with Patient 06/11/11 1841      Chief Complaint  Patient presents with  . Abdominal Pain    (Consider location/radiation/quality/duration/timing/severity/associated sxs/prior treatment) HPI Comments: Pt states that she has history of similar symptoms in the past and it was bv  Patient is a 25 y.o. female presenting with abdominal pain. The history is provided by the patient. No language interpreter was used.  Abdominal Pain The primary symptoms of the illness include abdominal pain and vaginal discharge. The primary symptoms of the illness do not include nausea, vomiting or dysuria. The current episode started more than 2 days ago. The onset of the illness was gradual. The problem has not changed since onset. The vaginal discharge is not associated with dysuria.   The patient states that she believes she is currently not pregnant. The patient has not had a change in bowel habit.    History reviewed. No pertinent past medical history.  History reviewed. No pertinent past surgical history.  No family history on file.  History  Substance Use Topics  . Smoking status: Current Some Day Smoker  . Smokeless tobacco: Not on file  . Alcohol Use: Yes    OB History    Grav Para Term Preterm Abortions TAB SAB Ect Mult Living   2 1 1  1 1    1       Review of Systems  Gastrointestinal: Positive for abdominal pain. Negative for nausea and vomiting.  Genitourinary: Positive for vaginal discharge. Negative for dysuria.  All other systems reviewed and are negative.    Allergies  Review of patient's allergies indicates no known allergies.  Home Medications  No current outpatient prescriptions on file.  BP 110/68  Pulse 68  Temp(Src) 98.7 F (37.1 C) (Oral)  Resp 16  SpO2 100%  Physical Exam  Nursing note and vitals reviewed. Constitutional: She is oriented to person, place,  and time. She appears well-developed and well-nourished.  HENT:  Head: Normocephalic and atraumatic.  Neck: Normal range of motion.  Cardiovascular: Normal rate and regular rhythm.   Pulmonary/Chest: Effort normal and breath sounds normal.  Abdominal: Soft. Bowel sounds are normal.  Neurological: She is alert and oriented to person, place, and time.  Skin: Skin is warm and dry.  Psychiatric: She has a normal mood and affect.    ED Course  Procedures (including critical care time)  Labs Reviewed  WET PREP, GENITAL - Abnormal; Notable for the following:    Clue Cells, Wet Prep MANY (*)    WBC, Wet Prep HPF POC MODERATE (*)    All other components within normal limits  URINALYSIS, ROUTINE W REFLEX MICROSCOPIC  PREGNANCY, URINE  GC/CHLAMYDIA PROBE AMP, GENITAL   No results found.   1. Bacterial vaginosis       MDM  Abdomen non surgical on exam:will treat for WU:[id number] sent       Teressa Lower, NP 06/11/11 1956

## 2011-06-13 LAB — GC/CHLAMYDIA PROBE AMP, GENITAL
Chlamydia, DNA Probe: NEGATIVE
GC Probe Amp, Genital: NEGATIVE

## 2011-07-03 ENCOUNTER — Encounter (HOSPITAL_BASED_OUTPATIENT_CLINIC_OR_DEPARTMENT_OTHER): Payer: Self-pay | Admitting: Emergency Medicine

## 2011-07-03 ENCOUNTER — Emergency Department (HOSPITAL_BASED_OUTPATIENT_CLINIC_OR_DEPARTMENT_OTHER)
Admission: EM | Admit: 2011-07-03 | Discharge: 2011-07-03 | Disposition: A | Discharge status: Home / Self Care | Payer: Medicaid Other | Attending: Emergency Medicine | Admitting: Emergency Medicine

## 2011-07-03 DIAGNOSIS — A499 Bacterial infection, unspecified: Secondary | ICD-10-CM | POA: Insufficient documentation

## 2011-07-03 DIAGNOSIS — F17200 Nicotine dependence, unspecified, uncomplicated: Secondary | ICD-10-CM | POA: Insufficient documentation

## 2011-07-03 DIAGNOSIS — N760 Acute vaginitis: Secondary | ICD-10-CM | POA: Insufficient documentation

## 2011-07-03 DIAGNOSIS — B9689 Other specified bacterial agents as the cause of diseases classified elsewhere: Secondary | ICD-10-CM | POA: Insufficient documentation

## 2011-07-03 LAB — WET PREP, GENITAL: Trich, Wet Prep: NONE SEEN

## 2011-07-03 LAB — URINALYSIS, ROUTINE W REFLEX MICROSCOPIC
Bilirubin Urine: NEGATIVE
Hgb urine dipstick: NEGATIVE
Specific Gravity, Urine: 1.008 (ref 1.005–1.030)
Urobilinogen, UA: 0.2 mg/dL (ref 0.0–1.0)
pH: 7.5 (ref 5.0–8.0)

## 2011-07-03 LAB — PREGNANCY, URINE: Preg Test, Ur: NEGATIVE

## 2011-07-03 MED ORDER — METRONIDAZOLE 500 MG PO TABS: 500.0000 mg | ORAL_TABLET | Freq: Two times a day (BID) | ORAL | Status: AC

## 2011-07-03 NOTE — ED Notes (Signed)
Pt states she was seen here 2 weeks ago dx with bacterial vaginosis. Pt states symptoms improved after abx but have returned. Pt c/o vaginal discharge and odor.

## 2011-07-03 NOTE — ED Provider Notes (Signed)
History     CSN: [id number]  Arrival date & time 07/03/11  1929   First MD Initiated Contact with Patient 07/03/11 1957      Chief Complaint  Patient presents with  . Vaginitis    (Consider location/radiation/quality/duration/timing/severity/associated sxs/prior treatment) Patient is a 25 y.o. female presenting with vaginal discharge. The history is provided by the patient. No language interpreter was used.  Vaginal Discharge This is a recurrent problem. The current episode started in the past 7 days. The problem occurs constantly. The problem has been unchanged. Pertinent negatives include no fever, rash, vomiting or weakness. The symptoms are aggravated by nothing. She has tried nothing for the symptoms.    History reviewed. No pertinent past medical history.  History reviewed. No pertinent past surgical history.  No family history on file.  History  Substance Use Topics  . Smoking status: Current Some Day Smoker  . Smokeless tobacco: Not on file  . Alcohol Use: Yes    OB History    Grav Para Term Preterm Abortions TAB SAB Ect Mult Living   2 1 1  1 1    1       Review of Systems  Constitutional: Negative for fever.  Gastrointestinal: Negative for vomiting.  Genitourinary: Positive for vaginal discharge.  Skin: Negative for rash.  Neurological: Negative for weakness.  All other systems reviewed and are negative.    Allergies  Review of patient's allergies indicates no known allergies.  Home Medications   Current Outpatient Rx  Name Route Sig Dispense Refill  . METRONIDAZOLE 500 MG PO TABS Oral Take 1 tablet (500 mg total) by mouth 2 (two) times daily. 14 tablet 0    BP 127/80  Pulse 71  Temp(Src) 98.9 F (37.2 C) (Oral)  Resp 18  SpO2 97%  Physical Exam  Nursing note and vitals reviewed. Constitutional: She is oriented to person, place, and time. She appears well-developed and well-nourished.  HENT:  Head: Normocephalic and atraumatic.    Cardiovascular: Normal rate and regular rhythm.   Pulmonary/Chest: Effort normal and breath sounds normal.  Abdominal: Soft. There is no tenderness.  Genitourinary: Vaginal discharge found.       Pt has white vaginal discharge:neg cmt  Musculoskeletal: Normal range of motion.  Neurological: She is alert and oriented to person, place, and time.  Skin: Skin is warm and dry.    ED Course  Procedures (including critical care time)  Labs Reviewed  WET PREP, GENITAL - Abnormal; Notable for the following:    Clue Cells, Wet Prep FEW (*)    All other components within normal limits  URINALYSIS, ROUTINE W REFLEX MICROSCOPIC  PREGNANCY, URINE  GC/CHLAMYDIA PROBE AMP, GENITAL   No results found.   1. BV (bacterial vaginosis)       MDM  Pt continuing to have WU:JWJX treat again:pt cultures negative last time and exam consistent with likely negative again       Teressa Lower, NP 07/03/11 2100

## 2011-07-05 NOTE — ED Provider Notes (Signed)
History/physical exam/procedure(s) were performed by non-physician practitioner and as supervising physician I was immediately available for consultation/collaboration. I have reviewed all notes and am in agreement with care and plan.   Hilario Quarry, MD 07/05/11 [phone]

## 2011-07-06 LAB — GC/CHLAMYDIA PROBE AMP, GENITAL
Chlamydia, DNA Probe: NEGATIVE
GC Probe Amp, Genital: NEGATIVE

## 2011-08-12 ENCOUNTER — Emergency Department (HOSPITAL_BASED_OUTPATIENT_CLINIC_OR_DEPARTMENT_OTHER)
Admission: EM | Admit: 2011-08-12 | Discharge: 2011-08-12 | Disposition: A | Discharge status: Home / Self Care | Payer: Medicaid Other | Attending: Emergency Medicine | Admitting: Emergency Medicine

## 2011-08-12 ENCOUNTER — Encounter (HOSPITAL_BASED_OUTPATIENT_CLINIC_OR_DEPARTMENT_OTHER): Payer: Self-pay | Admitting: Emergency Medicine

## 2011-08-12 DIAGNOSIS — B9689 Other specified bacterial agents as the cause of diseases classified elsewhere: Secondary | ICD-10-CM | POA: Insufficient documentation

## 2011-08-12 DIAGNOSIS — A499 Bacterial infection, unspecified: Secondary | ICD-10-CM | POA: Insufficient documentation

## 2011-08-12 DIAGNOSIS — N760 Acute vaginitis: Secondary | ICD-10-CM | POA: Insufficient documentation

## 2011-08-12 LAB — WET PREP, GENITAL: Yeast Wet Prep HPF POC: NONE SEEN

## 2011-08-12 LAB — URINALYSIS, DIPSTICK ONLY
Bilirubin Urine: NEGATIVE
Hgb urine dipstick: NEGATIVE
Ketones, ur: NEGATIVE mg/dL
Specific Gravity, Urine: 1.022 (ref 1.005–1.030)
Urobilinogen, UA: 1 mg/dL (ref 0.0–1.0)
pH: 7.5 (ref 5.0–8.0)

## 2011-08-12 MED ORDER — METRONIDAZOLE 500 MG PO TABS: 500.0000 mg | ORAL_TABLET | Freq: Two times a day (BID) | ORAL | Status: AC

## 2011-08-12 NOTE — ED Notes (Addendum)
Pt having vaginal discharge x 3 days.  White discharge, foul odor.  No bleeding.  No known fever.  Denies abdominal pain.

## 2011-08-12 NOTE — ED Provider Notes (Signed)
History     CSN: [id number]  Arrival date & time 08/12/11  1322   First MD Initiated Contact with Patient 08/12/11 1341      Chief Complaint  Patient presents with  . Vaginal Discharge   Patient with a known history of bacterial vaginosis. Presents with recurrent clear discharge. She feels it is similar to when she was treated for BV in the past. She describes slightly fishy odor. She also just came on her menstrual cycle. She has had no abdominal pain or fever. No dizziness. She is sexually active with one partner and uses barrier protection intermittently (Consider location/radiation/quality/duration/timing/severity/associated sxs/prior treatment) HPI  No past medical history on file.  No past surgical history on file.  No family history on file.  History  Substance Use Topics  . Smoking status: Never Smoker   . Smokeless tobacco: Not on file  . Alcohol Use: Yes     occasional    OB History    Grav Para Term Preterm Abortions TAB SAB Ect Mult Living   2 1 1  1 1    1       Review of Systems  All other systems reviewed and are negative.    Allergies  Review of patient's allergies indicates no known allergies.  Home Medications  No current outpatient prescriptions on file.  BP 130/76  Pulse 82  Temp(Src) 98 F (36.7 C) (Oral)  Resp 22  SpO2 99%  LMP 07/12/2011  Physical Exam  Nursing note and vitals reviewed. Constitutional: She is oriented to person, place, and time. She appears well-developed and well-nourished.  HENT:  Head: Normocephalic and atraumatic.  Eyes: Conjunctivae and EOM are normal. Pupils are equal, round, and reactive to light.  Neck: Neck supple.  Cardiovascular: Normal rate and regular rhythm.  Exam reveals no gallop and no friction rub.   No murmur heard. Pulmonary/Chest: Breath sounds normal. She has no wheezes. She has no rales. She exhibits no tenderness.  Abdominal: Soft. Bowel sounds are normal. She exhibits no distension. There  is no tenderness. There is no rebound and no guarding.  Genitourinary: Vagina normal.       The female nursing chaperone present during exam. Trace to minimal clear discharge, and blood, likely from cycle  Musculoskeletal: Normal range of motion.  Neurological: She is alert and oriented to person, place, and time. No cranial nerve deficit. Coordination normal.  Skin: Skin is warm and dry. No rash noted.  Psychiatric: She has a normal mood and affect.    ED Course  Procedures (including critical care time)   Labs Reviewed  PREGNANCY, URINE  URINALYSIS, DIPSTICK ONLY  GC/CHLAMYDIA PROBE AMP, GENITAL  WET PREP, GENITAL   No results found.   No diagnosis found.    MDM  Patient is seen and examined, initial history and physical is completed. Evaluation initiated     Urinalysis, and urine pregnant, be sent off wet prep, and genital probe for GC and Chlamydia   2:23 PM  Results for orders placed during the hospital encounter of 08/12/11  PREGNANCY, URINE      Component Value Range   Preg Test, Ur NEGATIVE  NEGATIVE   URINALYSIS, DIPSTICK ONLY      Component Value Range   Specific Gravity, Urine 1.022  1.005 - 1.030    pH 7.5  5.0 - 8.0    Glucose, UA NEGATIVE  NEGATIVE (mg/dL)   Hgb urine dipstick NEGATIVE  NEGATIVE    Bilirubin Urine NEGATIVE  NEGATIVE  Ketones, ur NEGATIVE  NEGATIVE (mg/dL)   Protein, ur NEGATIVE  NEGATIVE (mg/dL)   Urobilinogen, UA 1.0  0.0 - 1.0 (mg/dL)   Nitrite NEGATIVE  NEGATIVE    Leukocytes, UA NEGATIVE  NEGATIVE   WET PREP, GENITAL      Component Value Range   Yeast Wet Prep HPF POC NONE SEEN  NONE SEEN    Trich, Wet Prep NONE SEEN  NONE SEEN    Clue Cells Wet Prep HPF POC FEW (*) NONE SEEN    WBC, Wet Prep HPF POC MODERATE (*) NONE SEEN    No results found.    Annleigh Knueppel A. Patrica Duel, MD 08/12/11 1423

## 2011-08-13 LAB — GC/CHLAMYDIA PROBE AMP, GENITAL: Chlamydia, DNA Probe: NEGATIVE

## 2011-08-24 ENCOUNTER — Emergency Department (HOSPITAL_BASED_OUTPATIENT_CLINIC_OR_DEPARTMENT_OTHER)
Admission: EM | Admit: 2011-08-24 | Discharge: 2011-08-25 | Disposition: A | Discharge status: Home / Self Care | Payer: Medicaid Other | Attending: Emergency Medicine | Admitting: Emergency Medicine

## 2011-08-24 ENCOUNTER — Encounter (HOSPITAL_BASED_OUTPATIENT_CLINIC_OR_DEPARTMENT_OTHER): Payer: Self-pay | Admitting: *Deleted

## 2011-08-24 DIAGNOSIS — N898 Other specified noninflammatory disorders of vagina: Secondary | ICD-10-CM | POA: Insufficient documentation

## 2011-08-24 HISTORY — DX: Other specified bacterial agents as the cause of diseases classified elsewhere: B96.89

## 2011-08-24 HISTORY — DX: Acute vaginitis: N76.0

## 2011-08-24 NOTE — ED Provider Notes (Signed)
History   This chart was scribed for Tiffany Disney Smitty Cords, MD by Melba Coon. The patient was seen in room MH12/MH12 and the patient's care was started at 11:40PM.    CSN: [id number]  Arrival date & time 08/24/11  2317   First MD Initiated Contact with Patient 08/24/11 2339      Chief Complaint  Patient presents with  . Vaginal Discharge    (Consider location/radiation/quality/duration/timing/severity/associated sxs/prior treatment) The history is provided by the patient. No language interpreter was used.   Tiffany Wilkinson is a 26 y.o. female who presents to the Emergency Department complaining of persistent, moderate to severe white vaginal discharge with an onset 2 weeks ago. Pt came into ED at onset to get treated for bacterial vaginosis. Pt has Hx recurrent bacterial vaginal infections (except when she was pregnant), but pt states this is the first time that medications prescribed have not worked. Last visit, pt was prescribed methonodozole and flagellin for 7 days, ,but pt missed a morning and evening dose on different days. Pt has not had intercourse since onset. LNMP: at onset. PCP has not said anything about tests regarding why these bacterial infections keep occuring. Pt uses tampons. No burning or increased frequency of urination. No other pertinent medical symptoms.  PCP[facility]   Past Medical History  Diagnosis Date  . Bacterial vaginosis     History reviewed. No pertinent past surgical history.  No family history on file.  History  Substance Use Topics  . Smoking status: Never Smoker   . Smokeless tobacco: Not on file  . Alcohol Use: Yes     occasional    OB History    Grav Para Term Preterm Abortions TAB SAB Ect Mult Living   2 1 1  1 1    1       Review of Systems  Constitutional: Negative.   HENT: Negative.   Eyes: Negative.   Respiratory: Negative.   Cardiovascular: Negative.   Gastrointestinal: Negative.   Genitourinary: Positive  for vaginal discharge. Negative for vaginal bleeding and vaginal pain.  Musculoskeletal: Negative.   Skin: Negative.   Neurological: Negative.   Hematological: Negative.   Psychiatric/Behavioral: Negative.    10 Systems reviewed and are negative for acute change except as noted in the HPI.  Allergies  Review of patient's allergies indicates no known allergies.  Home Medications  No current outpatient prescriptions on file.  BP 128/75  Pulse 64  Temp(Src) 98.4 F (36.9 C) (Oral)  Resp 16  Ht 5\' 8"  (1.727 m)  Wt 172 lb (78.019 kg)  BMI 26.15 kg/m2  SpO2 100%  LMP 08/12/2011  Physical Exam  Nursing note and vitals reviewed. Constitutional: She is oriented to person, place, and time. She appears well-developed and well-nourished.       Awake, alert, nontoxic appearance.  HENT:  Head: Normocephalic and atraumatic.  Mouth/Throat: Oropharynx is clear and moist.  Eyes: Conjunctivae and EOM are normal. Pupils are equal, round, and reactive to light. Right eye exhibits no discharge. Left eye exhibits no discharge.  Neck: Normal range of motion. Neck supple.  Cardiovascular: Normal rate, regular rhythm, normal heart sounds and intact distal pulses.   No murmur heard. Pulmonary/Chest: Effort normal and breath sounds normal. She has no wheezes. She exhibits no tenderness.  Abdominal: Soft. Bowel sounds are normal. She exhibits no distension. There is no tenderness. There is no rebound and no guarding.  Genitourinary:       Pelvic Exam was done  with chaperone [name]) present: white vaginal d/c, no CMT, no adenopathy.  Musculoskeletal: She exhibits no tenderness.       Baseline ROM, no obvious new focal weakness.  Neurological: She is alert and oriented to person, place, and time.       Mental status and motor strength appears baseline for patient and situation.  Skin: Skin is warm. No rash noted.  Psychiatric: She has a normal mood and affect. Her behavior is normal.    ED Course    Procedures (including critical care time)  DIAGNOSTIC STUDIES: Oxygen Saturation is 100% on room air, normal by my interpretation.    COORDINATION OF CARE:  11:44AM - EDMD will do blood work for pt   Labs Reviewed - No data to display No results found.   No diagnosis found.    MDM  Follow up with your regular gynecologist for ongoing care and pap smear.  Patient verbalizes understanding and agrees to follow up I personally performed the services described in this documentation, which was scribed in my presence. The recorded information has been reviewed and considered.        Jasmine Awe, MD 08/25/11 [phone]

## 2011-08-24 NOTE — ED Notes (Signed)
Pt was seem here last week and dx'd with vaginosis. Pt completed meds but continues to have same sxs.

## 2011-08-25 LAB — WET PREP, GENITAL: Yeast Wet Prep HPF POC: NONE SEEN

## 2011-09-18 ENCOUNTER — Encounter (HOSPITAL_COMMUNITY): Payer: Self-pay | Admitting: *Deleted

## 2011-09-18 ENCOUNTER — Inpatient Hospital Stay (HOSPITAL_COMMUNITY)
Admission: AD | Admit: 2011-09-18 | Discharge: 2011-09-18 | Disposition: A | Discharge status: Home / Self Care | Payer: Medicaid Other | Source: Ambulatory Visit | Attending: Obstetrics & Gynecology | Admitting: Obstetrics & Gynecology

## 2011-09-18 DIAGNOSIS — N949 Unspecified condition associated with female genital organs and menstrual cycle: Secondary | ICD-10-CM | POA: Insufficient documentation

## 2011-09-18 DIAGNOSIS — B9689 Other specified bacterial agents as the cause of diseases classified elsewhere: Secondary | ICD-10-CM | POA: Insufficient documentation

## 2011-09-18 DIAGNOSIS — N898 Other specified noninflammatory disorders of vagina: Secondary | ICD-10-CM

## 2011-09-18 DIAGNOSIS — N760 Acute vaginitis: Secondary | ICD-10-CM | POA: Insufficient documentation

## 2011-09-18 DIAGNOSIS — A499 Bacterial infection, unspecified: Secondary | ICD-10-CM | POA: Insufficient documentation

## 2011-09-18 LAB — WET PREP, GENITAL: Trich, Wet Prep: NONE SEEN

## 2011-09-18 MED ORDER — PRENATAL RX 60-1 MG PO TABS: 1.0000 | ORAL_TABLET | Freq: Every day | ORAL | Status: AC

## 2011-09-18 MED ORDER — METRONIDAZOLE 500 MG PO TABS: 500.0000 mg | ORAL_TABLET | Freq: Two times a day (BID) | ORAL | Status: DC

## 2011-09-18 NOTE — MAU Note (Signed)
Pt states hx bv, last visit dx'd with trich, having low back pain like prior dx. Notes itching, odor to d/c, notes x3 weeks. LMP-09/10/2011. Denies uti s/s.

## 2011-09-18 NOTE — MAU Provider Note (Signed)
   Tiffany Wilkinson is  26 y.o. G2P1011 at Unknown presents complaining of Vaginal Discharge charges malodorous and sometimes a dry is to return yellow on her underwear.States she gets frequent bacteria vaginosis infections. Occ LBP. Just finished menses. Not sexually active for several weeks due to fianc in prison. Would like to become pregnant when he returns so not on contraception  Past Medical History: Past Medical History  Diagnosis Date  . Bacterial vaginosis     Past Surgical History: Past Surgical History  Procedure Date  . No past surgeries     Family History: Family History  Problem Relation Age of Onset  . Anesthesia problems Neg Hx     Social History: History  Substance Use Topics  . Smoking status: Never Smoker   . Smokeless tobacco: Not on file  . Alcohol Use: Yes     occasional    Allergies: No Known Allergies  No prescriptions prior to admission      Physical Exam   Blood pressure 113/76, pulse 75, temperature 97.3 F (36.3 C), temperature source Oral, resp. rate 16, height 5\' 9"  (1.753 m), weight 79.493 kg (175 lb 4 oz), last menstrual period 09/10/2011.  General: General appearance - alert, well appearing, and in no distress, oriented to person, place, and time and normal appearing weight Heart - normal rate, regular rhythm, normal S1, S2, no murmurs, rubs, clicks or gallops, not examined Pelvic - normal external genitalia, vulva, vagina, cervix, uterus and adnexa, VULVA: normal appearing vulva with no masses, tenderness or lesions, VAGINA: normal appearing vagina with normal color and discharge, no lesions, vaginal discharge - white and frothy, CERVIX: normal appearing cervix without discharge or lesions, UTERUS: uterus is normal size, shape, consistency and nontender, ADNEXA: normal adnexa in size, nontender and no masses Back exam - full range of motion, no tenderness, palpable spasm or pain on motion    Labs: Results for orders placed during  the hospital encounter of 09/18/11 (from the past 24 hour(s))  WET PREP, GENITAL     Status: Abnormal   Collection Time   09/18/11 12:13 PM      Component Value Range   Yeast Wet Prep HPF POC NONE SEEN  NONE SEEN    Trich, Wet Prep NONE SEEN  NONE SEEN    Clue Cells Wet Prep HPF POC MODERATE (*) NONE SEEN    WBC, Wet Prep HPF POC MODERATE (*) NONE SEEN    No results found for this or any previous visit (from the past 24 hour(s)). Imaging Studies:  No results found.   Assessment: Tiffany Wilkinson is  27 y.o. G2P1011 with recurrent BV  Plan: Rx Flagyl and PNVs to start when attempting pregnancy Discussed normalizing vaginal pH  Whisper Kurka,DEIRDRE3/15/201312:01 PM

## 2011-09-18 NOTE — MAU Note (Signed)
Pt in c/o symptoms of bacterial vaginosis.  States it started a month ago, was seen in [place] and did not find anything.  Reports a foul odor, but states it comes and goes.  Hx of recurrent BV.

## 2011-09-18 NOTE — Discharge Instructions (Signed)
Bacterial Vaginosis Bacterial vaginosis (BV) is a vaginal infection where the normal balance of bacteria in the vagina is disrupted. The normal balance is then replaced by an overgrowth of certain bacteria. There are several different kinds of bacteria that can cause BV. BV is the most common vaginal infection in women of childbearing age. CAUSES   The cause of BV is not fully understood. BV develops when there is an increase or imbalance of harmful bacteria.   Some activities or behaviors can upset the normal balance of bacteria in the vagina and put women at increased risk including:   Having a new sex partner or multiple sex partners.   Douching.   Using an intrauterine device (IUD) for contraception.   It is not clear what role sexual activity plays in the development of BV. However, women that have never had sexual intercourse are rarely infected with BV.  Women do not get BV from toilet seats, bedding, swimming pools or from touching objects around them.  SYMPTOMS   Grey vaginal discharge.   A fish-like odor with discharge, especially after sexual intercourse.   Itching or burning of the vagina and vulva.   Burning or pain with urination.   Some women have no signs or symptoms at all.  DIAGNOSIS  Your caregiver must examine the vagina for signs of BV. Your caregiver will perform lab tests and look at the sample of vaginal fluid through a microscope. They will look for bacteria and abnormal cells (clue cells), a pH test higher than 4.5, and a positive amine test all associated with BV.  RISKS AND COMPLICATIONS   Pelvic inflammatory disease (PID).   Infections following gynecology surgery.   Developing HIV.   Developing herpes virus.  TREATMENT  Sometimes BV will clear up without treatment. However, all women with symptoms of BV should be treated to avoid complications, especially if gynecology surgery is planned. Female partners generally do not need to be treated. However,  BV may spread between female sex partners so treatment is helpful in preventing a recurrence of BV.   BV may be treated with antibiotics. The antibiotics come in either pill or vaginal cream forms. Either can be used with nonpregnant or pregnant women, but the recommended dosages differ. These antibiotics are not harmful to the baby.   BV can recur after treatment. If this happens, a second round of antibiotics will often be prescribed.   Treatment is important for pregnant women. If not treated, BV can cause a premature delivery, especially for a pregnant woman who had a premature birth in the past. All pregnant women who have symptoms of BV should be checked and treated.   For chronic reoccurrence of BV, treatment with a type of prescribed gel vaginally twice a week is helpful.  HOME CARE INSTRUCTIONS   Finish all medication as directed by your caregiver.   Do not have sex until treatment is completed.   Tell your sexual partner that you have a vaginal infection. They should see their caregiver and be treated if they have problems, such as a mild rash or itching.   Practice safe sex. Use condoms. Only have 1 sex partner.  PREVENTION  Basic prevention steps can help reduce the risk of upsetting the natural balance of bacteria in the vagina and developing BV:  Do not have sexual intercourse (be abstinent).   Do not douche.   Use all of the medicine prescribed for treatment of BV, even if the signs and symptoms go away.     Tell your sex partner if you have BV. That way, they can be treated, if needed, to prevent reoccurrence.  SEEK MEDICAL CARE IF:   Your symptoms are not improving after 3 days of treatment.   You have increased discharge, pain, or fever.  MAKE SURE YOU:   Understand these instructions.   Will watch your condition.   Will get help right away if you are not doing well or get worse.  FOR MORE INFORMATION  Division of STD Prevention (DSTDP), Centers for Disease  Control and Prevention: www.cdc.gov/std American [employer] Health Association (ASHA): www.ashastd.org  Document Released: 06/22/2005 Document Revised: 06/11/2011 Document Reviewed: 12/13/2008 Exit[employer] Patient Information 2012 [employer]. 

## 2011-09-19 LAB — GC/CHLAMYDIA PROBE AMP, GENITAL: GC Probe Amp, Genital: NEGATIVE

## 2011-09-27 ENCOUNTER — Inpatient Hospital Stay (HOSPITAL_COMMUNITY)
Admission: AD | Admit: 2011-09-27 | Discharge: 2011-09-27 | Disposition: A | Discharge status: Home / Self Care | Payer: Medicaid Other | Source: Ambulatory Visit | Attending: Obstetrics & Gynecology | Admitting: Obstetrics & Gynecology

## 2011-09-27 ENCOUNTER — Encounter (HOSPITAL_COMMUNITY): Payer: Self-pay | Admitting: *Deleted

## 2011-09-27 DIAGNOSIS — B9689 Other specified bacterial agents as the cause of diseases classified elsewhere: Secondary | ICD-10-CM

## 2011-09-27 DIAGNOSIS — N949 Unspecified condition associated with female genital organs and menstrual cycle: Secondary | ICD-10-CM | POA: Insufficient documentation

## 2011-09-27 DIAGNOSIS — A499 Bacterial infection, unspecified: Secondary | ICD-10-CM

## 2011-09-27 DIAGNOSIS — N760 Acute vaginitis: Secondary | ICD-10-CM | POA: Insufficient documentation

## 2011-09-27 LAB — URINE MICROSCOPIC-ADD ON

## 2011-09-27 LAB — URINALYSIS, ROUTINE W REFLEX MICROSCOPIC
Hgb urine dipstick: NEGATIVE
Ketones, ur: NEGATIVE mg/dL
Protein, ur: NEGATIVE mg/dL
Urobilinogen, UA: 1 mg/dL (ref 0.0–1.0)

## 2011-09-27 LAB — WET PREP, GENITAL

## 2011-09-27 LAB — POCT PREGNANCY, URINE: Preg Test, Ur: NEGATIVE

## 2011-09-27 MED ORDER — METRONIDAZOLE 500 MG PO TABS: 500.0000 mg | ORAL_TABLET | Freq: Two times a day (BID) | ORAL | Status: AC

## 2011-09-27 NOTE — Discharge Instructions (Signed)
Bacterial Vaginosis Bacterial vaginosis is an infection of the vagina. A healthy vagina has many kinds of good germs (bacteria). Sometimes the number of good germs can change. This allows bad germs to move in and cause an infection. You may be given medicine (antibiotics) to treat the infection. Or, you may not need treatment at all. HOME CARE  Take your medicine as told. Finish them even if you start to feel better.   Do not have sex until you finish your medicine.   Do not douche.   Practice safe sex.   Tell your sex partner that you have an infection. They should see their doctor for treatment if they have problems.  GET HELP RIGHT AWAY IF:  You do not get better after 3 days of treatment.   You have grey fluid (discharge) coming from your vagina.   You have pain.   You have a temperature of 102 F (38.9 C) or higher.  MAKE SURE YOU:   Understand these instructions.   Will watch your condition.   Will get help right away if you are not doing well or get worse.  Document Released: 03/31/2008 Document Revised: 06/11/2011 Document Reviewed: 03/31/2008 Exit[employer] Patient Information 2012 [employer]. 

## 2011-09-27 NOTE — MAU Provider Note (Signed)
Attestation of Attending Supervision of Advanced Practitioner: Evaluation and management procedures were performed by the PA/NP/CNM/OB Fellow under my supervision/collaboration. Chart reviewed, and agree with management and plan.  Jaynie Collins, M.D. 09/27/2011 7:40 PM

## 2011-09-27 NOTE — MAU Provider Note (Signed)
Chief Complaint:  Vaginal Discharge    First Provider Initiated Contact with Patient 09/27/11 1718      Tiffany Wilkinson is  26 y.o. G2P1011.  Patient's last menstrual period was 09/10/2011.. She presents complaining of Vaginal Discharge  Reports recurrent BV. States she was treated in MAU ~ 2 weeks ago. Symptoms resolve for a few days but have now returned.   Obstetrical/Gynecological History: OB History    Grav Para Term Preterm Abortions TAB SAB Ect Mult Living   2 1 1  1 1    1       Past Medical History: Past Medical History  Diagnosis Date  . Bacterial vaginosis     Past Surgical History: Past Surgical History  Procedure Date  . No past surgeries     Family History: Family History  Problem Relation Age of Onset  . Anesthesia problems Neg Hx     Social History: History  Substance Use Topics  . Smoking status: Never Smoker   . Smokeless tobacco: Not on file  . Alcohol Use: Yes     occasional    Allergies: No Known Allergies  Prescriptions prior to admission  Medication Sig Dispense Refill  . Lactobacillus (ACIDOPHILUS PO) Take 1 tablet by mouth daily.      . metroNIDAZOLE (FLAGYL) 500 MG tablet Take 1 tablet (500 mg total) by mouth every 12 (twelve) hours.  10 tablet  0  . Prenatal Vit-Fe Fumarate-FA (PRENATAL MULTIVITAMIN) 60-1 MG tablet Take 1 tablet by mouth daily.  30 tablet  4    Review of Systems - Negative except what has been reviewed in the HPI  Physical Exam   Blood pressure 120/72, pulse 71, temperature 98.4 F (36.9 C), temperature source Oral, resp. rate 18, height 5\' 9"  (1.753 m), weight 175 lb 3.2 oz (79.47 kg), last menstrual period 09/10/2011.  General: General appearance - alert, well appearing, and in no distress, oriented to person, place, and time and overweight Mental status - alert, oriented to person, place, and time, normal mood, behavior, speech, dress, motor activity, and thought processes, affect appropriate to mood Abdomen -  soft, nontender, nondistended, no masses or organomegaly Focused Gynecological Exam: VULVA: normal appearing vulva with no masses, tenderness or lesions, VAGINA: vaginal discharge - creamy and malodorous, CERVIX: normal appearing cervix without discharge or lesions  Labs: Recent Results (from the past 24 hour(s))  URINALYSIS, ROUTINE W REFLEX MICROSCOPIC   Collection Time   09/27/11  4:30 PM      Component Value Range   Color, Urine YELLOW  YELLOW    APPearance CLEAR  CLEAR    Specific Gravity, Urine 1.015  1.005 - 1.030    pH 7.5  5.0 - 8.0    Glucose, UA NEGATIVE  NEGATIVE (mg/dL)   Hgb urine dipstick NEGATIVE  NEGATIVE    Bilirubin Urine NEGATIVE  NEGATIVE    Ketones, ur NEGATIVE  NEGATIVE (mg/dL)   Protein, ur NEGATIVE  NEGATIVE (mg/dL)   Urobilinogen, UA 1.0  0.0 - 1.0 (mg/dL)   Nitrite NEGATIVE  NEGATIVE    Leukocytes, UA TRACE (*) NEGATIVE   URINE MICROSCOPIC-ADD ON   Collection Time   09/27/11  4:30 PM      Component Value Range   Squamous Epithelial / LPF FEW (*) RARE    WBC, UA 3-6  <3 (WBC/hpf)   RBC / HPF 0-2  <3 (RBC/hpf)   Bacteria, UA RARE  RARE   POCT PREGNANCY, URINE   Collection Time  09/27/11  5:00 PM      Component Value Range   Preg Test, Ur NEGATIVE  NEGATIVE   WET PREP, GENITAL   Collection Time   09/27/11  5:35 PM      Component Value Range   Yeast Wet Prep HPF POC NONE SEEN  NONE SEEN    Trich, Wet Prep NONE SEEN  NONE SEEN    Clue Cells Wet Prep HPF POC FEW (*) NONE SEEN    WBC, Wet Prep HPF POC MANY (*) NONE SEEN    Assessment: Recurrent BV  Plan: Discharge home Rx Flagyl BID x 7 days Recommend RepHresh vaginally q 3 days x 2 weeks and daily probiotics x 3 months Referral to [facility] for FU  Latesia Norrington E. 09/27/2011,5:56 PM

## 2011-09-27 NOTE — MAU Note (Signed)
Pt reports having recurrent Bacterial infections. Reports having a vaginal odor and itching yellow vaginal discharge.

## 2012-01-01 ENCOUNTER — Inpatient Hospital Stay (HOSPITAL_COMMUNITY)
Admission: AD | Admit: 2012-01-01 | Discharge: 2012-01-01 | Disposition: A | Discharge status: Home / Self Care | Payer: Medicaid Other | Source: Ambulatory Visit | Attending: Obstetrics & Gynecology | Admitting: Obstetrics & Gynecology

## 2012-01-01 ENCOUNTER — Encounter (HOSPITAL_COMMUNITY): Payer: Self-pay | Admitting: *Deleted

## 2012-01-01 DIAGNOSIS — A499 Bacterial infection, unspecified: Secondary | ICD-10-CM

## 2012-01-01 DIAGNOSIS — O2390 Unspecified genitourinary tract infection in pregnancy, unspecified trimester: Secondary | ICD-10-CM | POA: Insufficient documentation

## 2012-01-01 DIAGNOSIS — B9689 Other specified bacterial agents as the cause of diseases classified elsewhere: Secondary | ICD-10-CM | POA: Insufficient documentation

## 2012-01-01 DIAGNOSIS — N949 Unspecified condition associated with female genital organs and menstrual cycle: Secondary | ICD-10-CM | POA: Insufficient documentation

## 2012-01-01 DIAGNOSIS — N760 Acute vaginitis: Secondary | ICD-10-CM | POA: Insufficient documentation

## 2012-01-01 HISTORY — DX: Chlamydial infection, unspecified: A74.9

## 2012-01-01 HISTORY — DX: Urinary tract infection, site not specified: N39.0

## 2012-01-01 LAB — WET PREP, GENITAL: Trich, Wet Prep: NONE SEEN

## 2012-01-01 MED ORDER — METRONIDAZOLE 500 MG PO TABS: 500.0000 mg | ORAL_TABLET | Freq: Two times a day (BID) | ORAL | Status: DC

## 2012-01-01 MED ORDER — CLINDAMYCIN HCL 150 MG PO CAPS: 300.0000 mg | ORAL_CAPSULE | Freq: Two times a day (BID) | ORAL | Status: AC

## 2012-01-01 NOTE — MAU Note (Signed)
Patient is not in the lobby when called to triage.  

## 2012-01-01 NOTE — MAU Provider Note (Addendum)
  History     CSN: [id number]  Arrival date and time: 01/01/12 1637   None     Chief Complaint  Patient presents with  . Vaginal Discharge   HPI 26 yo, G3P1011 at 8.5 weeks seen for 2 weeks of foul vaginal odor and discharge.  Has history of recurrent BV.  Intercourse is a provoking factor.  Has taken probiotics and flagyl in the past, which helps.    OB History    Grav Para Term Preterm Abortions TAB SAB Ect Mult Living   3 1 1  1 1    1       Past Medical History  Diagnosis Date  . Bacterial vaginosis   . Urinary tract infection   . Chlamydia     Past Surgical History  Procedure Date  . No past surgeries   . Induced abortion     Family History  Problem Relation Age of Onset  . Anesthesia problems Neg Hx   . Other Neg Hx     History  Substance Use Topics  . Smoking status: Former Smoker    Types: Cigarettes  . Smokeless tobacco: Not on file   Comment: 2008  . Alcohol Use: Yes     occasional-not with preg    Allergies: No Known Allergies  Prescriptions prior to admission  Medication Sig Dispense Refill  . Prenatal Vit-Fe Fumarate-FA (PRENATAL MULTIVITAMIN) 60-1 MG tablet Take 1 tablet by mouth daily.  30 tablet  4    Review of Systems  All other systems reviewed and are negative.   Physical Exam   Blood pressure 119/70, pulse 79, temperature 99.6 F (37.6 C), temperature source Oral, resp. rate 16, height 5\' 7"  (1.702 m), weight 79.924 kg (176 lb 3.2 oz), last menstrual period 09/10/2011, SpO2 100.00%.  Physical Exam  Constitutional: She is oriented to person, place, and time. She appears well-developed and well-nourished.  Genitourinary:       Normal external genitalia.  Copious light yellow discharge.  Neurological: She is alert and oriented to person, place, and time.  Skin: Skin is warm and dry.  Psychiatric: She has a normal mood and affect. Her behavior is normal. Judgment and thought content normal.   Results for orders placed during the  hospital encounter of 01/01/12 (from the past 24 hour(s))  POCT PREGNANCY, URINE     Status: Abnormal   Collection Time   01/01/12  5:06 PM      Component Value Range   Preg Test, Ur POSITIVE (*) NEGATIVE  WET PREP, GENITAL     Status: Abnormal   Collection Time   01/01/12  5:44 PM      Component Value Range   Yeast Wet Prep HPF POC NONE SEEN  NONE SEEN   Trich, Wet Prep NONE SEEN  NONE SEEN   Clue Cells Wet Prep HPF POC FEW (*) NONE SEEN   WBC, Wet Prep HPF POC TOO NUMEROUS TO COUNT (*) NONE SEEN    MAU Course  Procedures  Assessment and Plan  1.  BV  Will treat will clindamycin 300mg  bid.  F/u as needed.  Teauna Dubach JEHIEL 01/01/2012, 5:52 PM

## 2012-01-01 NOTE — MAU Note (Signed)
Patient states she has a milky vaginal discharge with odor for a couple of weeks. Has a history of recurrent bacterial infections. Denies any pain.

## 2012-01-01 NOTE — MAU Note (Addendum)
Discharge with odor- "something that haunts me"; started a couple wks ago.  Had  Done a home test and gone to health dept for confirmation.  EDC given to her at health dept based on LMP

## 2012-01-01 NOTE — Discharge Instructions (Signed)
Bacterial Vaginosis Bacterial vaginosis is an infection of the vagina. A healthy vagina has many kinds of good germs (bacteria). Sometimes the number of good germs can change. This allows bad germs to move in and cause an infection. You may be given medicine (antibiotics) to treat the infection. Or, you may not need treatment at all. HOME CARE  Take your medicine as told. Finish them even if you start to feel better.   Do not have sex until you finish your medicine.   Do not douche.   Practice safe sex.   Tell your sex partner that you have an infection. They should see their doctor for treatment if they have problems.  GET HELP RIGHT AWAY IF:  You do not get better after 3 days of treatment.   You have grey fluid (discharge) coming from your vagina.   You have pain.   You have a temperature of 102 F (38.9 C) or higher.  MAKE SURE YOU:   Understand these instructions.   Will watch your condition.   Will get help right away if you are not doing well or get worse.  Document Released: 03/31/2008 Document Revised: 06/11/2011 Document Reviewed: 03/31/2008 Exit[employer] Patient Information 2012 [employer]. 

## 2012-01-13 ENCOUNTER — Ambulatory Visit (INDEPENDENT_AMBULATORY_CARE_PROVIDER_SITE_OTHER): Payer: Medicaid Other

## 2012-01-13 ENCOUNTER — Ambulatory Visit (INDEPENDENT_AMBULATORY_CARE_PROVIDER_SITE_OTHER): Payer: Medicaid Other | Admitting: Obstetrics and Gynecology

## 2012-01-13 ENCOUNTER — Encounter: Payer: Self-pay | Admitting: Obstetrics and Gynecology

## 2012-01-13 VITALS — BP 102/60 | Wt 172.0 lb

## 2012-01-13 DIAGNOSIS — Z331 Pregnant state, incidental: Secondary | ICD-10-CM

## 2012-01-13 DIAGNOSIS — O26849 Uterine size-date discrepancy, unspecified trimester: Secondary | ICD-10-CM

## 2012-01-13 LAB — POCT WET PREP (WET MOUNT): Clue Cells Wet Prep Whiff POC: NEGATIVE

## 2012-01-13 LAB — POCT URINALYSIS DIPSTICK
Bilirubin, UA: NEGATIVE
Ketones, UA: NEGATIVE
Nitrite, UA: NEGATIVE
pH, UA: 7

## 2012-01-13 NOTE — Progress Notes (Signed)
Pt here for new ob work up History reviewed Pt without complaints Physical Examination: General appearance - alert, well appearing, and in no distress Mental status - normal mood, behavior, speech, dress, motor activity, and thought processes Neck - supple, no significant adenopathy, thyroid exam: thyroid is normal in size without nodules or tenderness Chest - clear to auscultation, no wheezes, rales or rhonchi, symmetric air entry Heart - normal rate and regular rhythm Abdomen - soft, nontender, nondistended, no masses or organomegaly Breasts - breasts appear normal, no suspicious masses, no skin or nipple changes or axillary nodes Pelvic - normal external genitalia, vulva, vagina, cervix, uterus and adnexa Rectal - normal rectal, no masses, rectal exam not indicated Back exam - full range of motion, no tenderness, palpable spasm or pain on motion Neurological - alert, oriented, normal speech, no focal findings or movement disorder noted Musculoskeletal - no joint tenderness, deformity or swelling Extremities - no edema, redness or tenderness in the calves or thighs Skin - normal coloration and turgor, no rashes, no suspicious skin lesions noted Routine exam Pap sent yes with cultures.   Wet prep done Mammogram due no pt unsur of LMP will do dating [place] RT 4 weeks.  Pt desires first trimester screen will schedule. Dating US done s=d  Thickened nuchal region.  Will do first trimester screen to r/o cystic hygroma or abnormalities

## 2012-01-13 NOTE — Progress Notes (Signed)
Pt states no concerns today.   

## 2012-01-13 NOTE — Patient Instructions (Signed)
Patient Education Materials to be provided at check out (*indicates is located in Garment/textile technologist):  Genetic Disorders, Maternal Serum Screening ABCs of Pregnancy A Antepartum care is very important. Be sure you see your doctor and get prenatal care as soon as you think you are pregnant. At this time, you will be tested for infection, genetic abnormalities and potential problems with you and the pregnancy. This is the time to discuss diet, exercise, work, medications, labor, pain medication during labor and the possibility of a cesarean delivery. Ask any questions that may concern you. It is important to see your doctor regularly throughout your pregnancy. Avoid exposure to toxic substances and chemicals - such as cleaning solvents, lead and mercury, some insecticides, and paint. Pregnant women should avoid exposure to paint fumes, and fumes that cause you to feel ill, dizzy or faint. When possible, it is a good idea to have a pre-pregnancy consultation with your caregiver to begin some important recommendations your caregiver suggests such as, taking folic acid, exercising, quitting smoking, avoiding alcoholic beverages, etc. B Breastfeeding is the healthiest choice for both you and your baby. It has many nutritional benefits for the baby and health benefits for the mother. It also creates a very tight and loving bond between the baby and mother. Talk to your doctor, your family and friends, and your employer about how you choose to feed your baby and how they can support you in your decision. Not all birth defects can be prevented, but a woman can take actions that may increase her chance of having a healthy baby. Many birth defects happen very early in pregnancy, sometimes before a woman even knows she is pregnant. Birth defects or abnormalities of any child in your or the father's family should be discussed with your caregiver. Get a good support bra as your breast size changes. Wear it especially when you  exercise and when nursing.  C Celebrate the news of your pregnancy with the your spouse/father and family. Childbirth classes are helpful to take for you and the spouse/father because it helps to understand what happens during the pregnancy, labor and delivery. Cesarean delivery should be discussed with your doctor so you are prepared for that possibility. The pros and cons of circumcision if it is a boy, should be discussed with your pediatrician. Cigarette smoking during pregnancy can result in low birth weight babies. It has been associated with infertility, miscarriages, tubal pregnancies, infant death (mortality) and poor health (morbidity) in childhood. Additionally, cigarette smoking may cause long-term learning disabilities. If you smoke, you should try to quit before getting pregnant and not smoke during the pregnancy. Secondary smoke may also harm a mother and her developing baby. It is a good idea to ask people to stop smoking around you during your pregnancy and after the baby is born. Extra calcium is necessary when you are pregnant and is found in your prenatal vitamin, in dairy products, green leafy vegetables and in calcium supplements. D A healthy diet according to your current weight and height, along with vitamins and mineral supplements should be discussed with your caregiver. Domestic abuse or violence should be made known to your doctor right away to get the situation corrected. Drink more water when you exercise to keep hydrated. Discomfort of your back and legs usually develops and progresses from the middle of the second trimester through to delivery of the baby. This is because of the enlarging baby and uterus, which may also affect your balance. Do not take  illegal drugs. Illegal drugs can seriously harm the baby and you. Drink extra fluids (water is best) throughout pregnancy to help your body keep up with the increases in your blood volume. Drink at least 6 to 8 glasses of water,  fruit juice, or milk each day. A good way to know you are drinking enough fluid is when your urine looks almost like clear water or is very light yellow.  E Eat healthy to get the nutrients you and your unborn baby need. Your meals should include the five basic food groups. Exercise (30 minutes of light to moderate exercise a day) is important and encouraged during pregnancy, if there are no medical problems or problems with the pregnancy. Exercise that causes discomfort or dizziness should be stopped and reported to your caregiver. Emotions during pregnancy can change from being ecstatic to depression and should be understood by you, your partner and your family. F Fetal screening with ultrasound, amniocentesis and monitoring during pregnancy and labor is common and sometimes necessary. Take 400 micrograms of folic acid daily both before, when possible, and during the first few months of pregnancy to reduce the risk of birth defects of the brain and spine. All women who could possibly become pregnant should take a vitamin with folic acid, every day. It is also important to eat a healthy diet with fortified foods (enriched grain products, including cereals, rice, breads, and pastas) and foods with natural sources of folate (orange juice, green leafy vegetables, beans, peanuts, broccoli, asparagus, peas, and lentils). The father should be involved with all aspects of the pregnancy including, the prenatal care, childbirth classes, labor, delivery, and postpartum time. Fathers may also have emotional concerns about being a father, financial needs, and raising a family. G Genetic testing should be done appropriately. It is important to know your family and the father's history. If there have been problems with pregnancies or birth defects in your family, report these to your doctor. Also, genetic counselors can talk with you about the information you might need in making decisions about having a family. You can  call a major medical center in your area for help in finding a board-certified genetic counselor. Genetic testing and counseling should be done before pregnancy when possible, especially if there is a history of problems in the mother's or father's family. Certain ethnic backgrounds are more at risk for genetic defects. H Get familiar with the hospital where you will be having your baby. Get to know how long it takes to get there, the labor and delivery area, and the hospital procedures. Be sure your medical insurance is accepted there. Get your home ready for the baby including, clothes, the baby's room (when possible), furniture and car seat. Hand washing is important throughout the day, especially after handling raw meat and poultry, changing the baby's diaper or using the bathroom. This can help prevent the spread of many bacteria and viruses that cause infection. Your hair may become dry and thinner, but will return to normal a few weeks after the baby is born. Heartburn is a common problem that can be treated by taking antacids recommended by your caregiver, eating smaller meals 5 or 6 times a day, not drinking liquids when eating, drinking between meals and raising the head of your bed 2 to 3 inches. I Insurance to cover you, the baby, doctor and hospital should be reviewed so that you will be prepared to pay any costs not covered by your insurance plan. If you do not  have medical insurance, there are usually clinics and services available for you in your community. Take 30 milligrams of iron during your pregnancy as prescribed by your doctor to reduce the risk of low red blood cells (anemia) later in pregnancy. All women of childbearing age should eat a diet rich in iron. J There should be a joint effort for the mother, father and any other children to adapt to the pregnancy financially, emotionally, and psychologically during the pregnancy. Join a support group for moms-to-be. Or, join a class on  parenting or childbirth. Have the family participate when possible. K Know your limits. Let your caregiver know if you experience any of the following:   Pain of any kind.   Strong cramps.   You develop a lot of weight in a short period of time (5 pounds in 3 to 5 days).   Vaginal bleeding, leaking of amniotic fluid.   Headache, vision problems.   Dizziness, fainting, shortness of breath.   Chest pain.   Fever of 102 F (38.9 C) or higher.   Gush of clear fluid from your vagina.   Painful urination.   Domestic violence.   Irregular heartbeat (palpitations).   Rapid beating of the heart (tachycardia).   Constant feeling sick to your stomach (nauseous) and vomiting.   Trouble walking, fluid retention (edema).   Muscle weakness.   If your baby has decreased activity.   Persistent diarrhea.   Abnormal vaginal discharge.   Uterine contractions at 20-minute intervals.   Back pain that travels down your leg.  L Learn and practice that what you eat and drink should be in moderation and healthy for you and your baby. Legal drugs such as alcohol and caffeine are important issues for pregnant women. There is no safe amount of alcohol a woman can drink while pregnant. Fetal alcohol syndrome, a disorder characterized by growth retardation, facial abnormalities, and central nervous system dysfunction, is caused by a woman's use of alcohol during pregnancy. Caffeine, found in tea, coffee, soft drinks and chocolate, should also be limited. Be sure to read labels when trying to cut down on caffeine during pregnancy. More than 200 foods, beverages, and over-the-counter medications contain caffeine and have a high salt content! There are coffees and teas that do not contain caffeine. M Medical conditions such as diabetes, epilepsy, and high blood pressure should be treated and kept under control before pregnancy when possible, but especially during pregnancy. Ask your caregiver about  any medications that may need to be changed or adjusted during pregnancy. If you are currently taking any medications, ask your caregiver if it is safe to take them while you are pregnant or before getting pregnant when possible. Also, be sure to discuss any herbs or vitamins you are taking. They are medicines, too! Discuss with your doctor all medications, prescribed and over-the-counter, that you are taking. During your prenatal visit, discuss the medications your doctor may give you during labor and delivery. N Never be afraid to ask your doctor or caregiver questions about your health, the progress of the pregnancy, family problems, stressful situations, and recommendation for a pediatrician, if you do not have one. It is better to take all precautions and discuss any questions or concerns you may have during your office visits. It is a good idea to write down your questions before you visit the doctor. O Over-the-counter cough and cold remedies may contain alcohol or other ingredients that should be avoided during pregnancy. Ask your caregiver about prescription,  herbs or over-the-counter medications that you are taking or may consider taking while pregnant.  P Physical activity during pregnancy can benefit both you and your baby by lessening discomfort and fatigue, providing a sense of well-being, and increasing the likelihood of early recovery after delivery. Light to moderate exercise during pregnancy strengthens the belly (abdominal) and back muscles. This helps improve posture. Practicing yoga, walking, swimming, and cycling on a stationary bicycle are usually safe exercises for pregnant women. Avoid scuba diving, exercise at high altitudes (over 3000 feet), skiing, horseback riding, contact sports, etc. Always check with your doctor before beginning any kind of exercise, especially during pregnancy and especially if you did not exercise before getting pregnant. Q Queasiness, stomach upset and  morning sickness are common during pregnancy. Eating a couple of crackers or dry toast before getting out of bed. Foods that you normally love may make you feel sick to your stomach. You may need to substitute other nutritious foods. Eating 5 or 6 small meals a day instead of 3 large ones may make you feel better. Do not drink with your meals, drink between meals. Questions that you have should be written down and asked during your prenatal visits. R Read about and make plans to baby-proof your home. There are important tips for making your home a safer environment for your baby. Review the tips and make your home safer for you and your baby. Read food labels regarding calories, salt and fat content in the food. S Saunas, hot tubs, and steam rooms should be avoided while you are pregnant. Excessive high heat may be harmful during your pregnancy. Your caregiver will screen and examine you for sexually transmitted diseases and genetic disorders during your prenatal visits. Learn the signs of labor. Sexual relations while pregnant is safe unless there is a medical or pregnancy problem and your caregiver advises against it. T Traveling long distances should be avoided especially in the third trimester of your pregnancy. If you do have to travel out of state, be sure to take a copy of your medical records and medical insurance plan with you. You should not travel long distances without seeing your doctor first. Most airlines will not allow you to travel after 36 weeks of pregnancy. Toxoplasmosis is an infection caused by a parasite that can seriously harm an unborn baby. Avoid eating undercooked meat and handling cat litter. Be sure to wear gloves when gardening. Tingling of the hands and fingers is not unusual and is due to fluid retention. This will go away after the baby is born. U Womb (uterus) size increases during the first trimester. Your kidneys will begin to function more efficiently. This may cause you  to feel the need to urinate more often. You may also leak urine when sneezing, coughing or laughing. This is due to the growing uterus pressing against your bladder, which lies directly in front of and slightly under the uterus during the first few months of pregnancy. If you experience burning along with frequency of urination or bloody urine, be sure to tell your doctor. The size of your uterus in the third trimester may cause a problem with your balance. It is advisable to maintain good posture and avoid wearing high heels during this time. An ultrasound of your baby may be necessary during your pregnancy and is safe for you and your baby. V Vaccinations are an important concern for pregnant women. Get needed vaccines before pregnancy. Center for Disease Control ([employer]) has clear guidelines for  the use of vaccines during pregnancy. Review the list, be sure to discuss it with your doctor. Prenatal vitamins are helpful and healthy for you and the baby. Do not take extra vitamins except what is recommended. Taking too much of certain vitamins can cause overdose problems. Continuous vomiting should be reported to your caregiver. Varicose veins may appear especially if there is a family history of varicose veins. They should subside after the delivery of the baby. Support hose helps if there is leg discomfort. W Being overweight or underweight during pregnancy may cause problems. Try to get within 15 pounds of your ideal weight before pregnancy. Remember, pregnancy is not a time to be dieting! Do not stop eating or start skipping meals as your weight increases. Both you and your baby need the calories and nutrition you receive from a healthy diet. Be sure to consult with your doctor about your diet. There is a formula and diet plan available depending on whether you are overweight or underweight. Your caregiver or nutritionist can help and advise you if necessary. X Avoid X-rays. If you must have dental  work or diagnostic tests, tell your dentist or physician that you are pregnant so that extra care can be taken. X-rays should only be taken when the risks of not taking them outweigh the risk of taking them. If needed, only the minimum amount of radiation should be used. When X-rays are necessary, protective lead shields should be used to cover areas of the body that are not being X-rayed. Y Your baby loves you. Breastfeeding your baby creates a loving and very close bond between the two of you. Give your baby a healthy environment to live in while you are pregnant. Infants and children require constant care and guidance. Their health and safety should be carefully watched at all times. After the baby is born, rest or take a nap when the baby is sleeping. Z Get your ZZZs. Be sure to get plenty of rest. Resting on your side as often as possible, especially on your left side is advised. It provides the best circulation to your baby and helps reduce swelling. Try taking a nap for 30 to 45 minutes in the afternoon when possible. After the baby is born rest or take a nap when the baby is sleeping. Try elevating your feet for that amount of time when possible. It helps the circulation in your legs and helps reduce swelling.  Most information courtesy of the CDC. Document Released: 06/22/2005 Document Revised: 06/11/2011 Document Reviewed: 03/06/2009 [facility] Patient Information 2012 [place], [place].

## 2012-01-13 NOTE — Progress Notes (Signed)
PT VERY UNSURE OF LMP

## 2012-01-14 ENCOUNTER — Other Ambulatory Visit: Payer: Self-pay | Admitting: Obstetrics and Gynecology

## 2012-01-14 DIAGNOSIS — Z331 Pregnant state, incidental: Secondary | ICD-10-CM

## 2012-01-14 LAB — PRENATAL PANEL VII
Antibody Screen: NEGATIVE
Eosinophils Absolute: 0.2 10*3/uL (ref 0.0–0.7)
Eosinophils Relative: 2 % (ref 0–5)
HCT: 36.5 % (ref 36.0–46.0)
Hemoglobin: 12.3 g/dL (ref 12.0–15.0)
Lymphocytes Relative: 19 % (ref 12–46)
Lymphs Abs: 2.3 10*3/uL (ref 0.7–4.0)
MCH: 29.7 pg (ref 26.0–34.0)
MCV: 88.2 fL (ref 78.0–100.0)
Monocytes Absolute: 0.7 10*3/uL (ref 0.1–1.0)
Monocytes Relative: 6 % (ref 3–12)
RBC: 4.14 MIL/uL (ref 3.87–5.11)
Rubella: 240.1 IU/mL — ABNORMAL HIGH

## 2012-01-14 LAB — US OB COMP LESS 14 WKS

## 2012-01-15 LAB — HEMOGLOBINOPATHY EVALUATION: Hgb A: 97.3 % (ref 96.8–97.8)

## 2012-01-15 LAB — CULTURE, OB URINE: Organism ID, Bacteria: NO GROWTH

## 2012-01-15 LAB — PAP IG, CT-NG, RFX HPV ASCU

## 2012-02-09 ENCOUNTER — Other Ambulatory Visit: Payer: Self-pay | Admitting: Obstetrics and Gynecology

## 2012-02-09 DIAGNOSIS — Z36 Encounter for antenatal screening of mother: Secondary | ICD-10-CM

## 2012-02-10 ENCOUNTER — Other Ambulatory Visit: Payer: Self-pay | Admitting: Obstetrics and Gynecology

## 2012-02-10 ENCOUNTER — Ambulatory Visit (INDEPENDENT_AMBULATORY_CARE_PROVIDER_SITE_OTHER): Payer: Medicaid Other

## 2012-02-10 DIAGNOSIS — Z36 Encounter for antenatal screening of mother: Secondary | ICD-10-CM

## 2012-02-10 LAB — US OB TRANSVAGINAL

## 2012-02-15 ENCOUNTER — Ambulatory Visit (INDEPENDENT_AMBULATORY_CARE_PROVIDER_SITE_OTHER): Payer: Medicaid Other | Admitting: Obstetrics and Gynecology

## 2012-02-15 ENCOUNTER — Encounter: Payer: Self-pay | Admitting: Obstetrics and Gynecology

## 2012-02-15 VITALS — BP 102/60 | Wt 173.0 lb

## 2012-02-15 DIAGNOSIS — Z3689 Encounter for other specified antenatal screening: Secondary | ICD-10-CM

## 2012-02-15 DIAGNOSIS — Z331 Pregnant state, incidental: Secondary | ICD-10-CM

## 2012-02-15 NOTE — Progress Notes (Signed)
Pt states she has had some abdominal pressure when standing up too fast.

## 2012-02-16 NOTE — Progress Notes (Signed)
[redacted]w[redacted]d Abdomen nontender. Normal first trimester screening ultrasound. Return to office in 5 weeks. Anatomy ultrasound next visit. Dr. Stefano Gaul

## 2012-02-27 ENCOUNTER — Encounter (HOSPITAL_BASED_OUTPATIENT_CLINIC_OR_DEPARTMENT_OTHER): Payer: Self-pay | Admitting: *Deleted

## 2012-02-27 DIAGNOSIS — N949 Unspecified condition associated with female genital organs and menstrual cycle: Secondary | ICD-10-CM | POA: Insufficient documentation

## 2012-02-27 DIAGNOSIS — O99891 Other specified diseases and conditions complicating pregnancy: Secondary | ICD-10-CM | POA: Insufficient documentation

## 2012-02-27 DIAGNOSIS — Z202 Contact with and (suspected) exposure to infections with a predominantly sexual mode of transmission: Secondary | ICD-10-CM | POA: Insufficient documentation

## 2012-02-27 DIAGNOSIS — R109 Unspecified abdominal pain: Secondary | ICD-10-CM | POA: Insufficient documentation

## 2012-02-27 NOTE — ED Notes (Signed)
Pt states she is 15 weeks preg. And has had low abd pain x 1 month. Boyfriend seen here Tuesday and dx'd with chlamydia. Pt was told to come here and be treated. Has many questions.

## 2012-02-28 ENCOUNTER — Inpatient Hospital Stay (HOSPITAL_COMMUNITY)
Admission: AD | Admit: 2012-02-28 | Discharge: 2012-02-28 | Disposition: A | Discharge status: Hospice | Payer: Medicaid Other | Source: Ambulatory Visit | Attending: Obstetrics and Gynecology | Admitting: Obstetrics and Gynecology

## 2012-02-28 ENCOUNTER — Encounter (HOSPITAL_COMMUNITY): Payer: Self-pay | Admitting: Obstetrics and Gynecology

## 2012-02-28 ENCOUNTER — Emergency Department (HOSPITAL_BASED_OUTPATIENT_CLINIC_OR_DEPARTMENT_OTHER)
Admission: EM | Admit: 2012-02-28 | Discharge: 2012-02-28 | Disposition: A | Discharge status: Home / Self Care | Payer: Medicaid Other | Source: Home / Self Care

## 2012-02-28 DIAGNOSIS — Z202 Contact with and (suspected) exposure to infections with a predominantly sexual mode of transmission: Secondary | ICD-10-CM | POA: Diagnosis present

## 2012-02-28 DIAGNOSIS — N949 Unspecified condition associated with female genital organs and menstrual cycle: Secondary | ICD-10-CM

## 2012-02-28 DIAGNOSIS — Z331 Pregnant state, incidental: Secondary | ICD-10-CM

## 2012-02-28 DIAGNOSIS — O283 Abnormal ultrasonic finding on antenatal screening of mother: Secondary | ICD-10-CM

## 2012-02-28 DIAGNOSIS — Z3687 Encounter for antenatal screening for uncertain dates: Secondary | ICD-10-CM

## 2012-02-28 LAB — URINALYSIS, ROUTINE W REFLEX MICROSCOPIC
Ketones, ur: NEGATIVE mg/dL
Leukocytes, UA: NEGATIVE
Leukocytes, UA: NEGATIVE
Nitrite: NEGATIVE
Nitrite: NEGATIVE
Protein, ur: NEGATIVE mg/dL
Protein, ur: NEGATIVE mg/dL
Specific Gravity, Urine: 1.031 — ABNORMAL HIGH (ref 1.005–1.030)
Urobilinogen, UA: 1 mg/dL (ref 0.0–1.0)
Urobilinogen, UA: 1 mg/dL (ref 0.0–1.0)

## 2012-02-28 LAB — WET PREP, GENITAL: Yeast Wet Prep HPF POC: NONE SEEN

## 2012-02-28 MED ORDER — AZITHROMYCIN 250 MG PO TABS
1000.0000 mg | ORAL_TABLET | Freq: Once | ORAL | Status: AC
  Administered 2012-02-28: 1000 mg via ORAL
  Filled 2012-02-28: qty 4

## 2012-02-28 NOTE — MAU Provider Note (Signed)
History   26 yo G3P1011 at 15 5/7 weeks presented unannounced c/o pelvic pressure x 1 month, largely concentrated in suprapubic area.  Denies leaking, bleeding, dysuria, fever, or back pain.  EPIC records show patient had presented to ER last night reporting her partner had been dx with chlamydia and she needed to be treated.  She left before being seen. Upon questioning today, she admits to that visit--partner had been evasive about his ER visit and subsequent treatment, and he reported he had thrown up the antibiotic soon after taking it.  Patient is concerned about the adequacy of that treatment.  Pregnancy remarkable for: Patient Active Problem List  Diagnosis  . Exposure to STD at 15 weeks  . Unsure of LMP (last menstrual period)  . Increased nuchal translucency space on fetal ultrasound in 1st trimester--resolved on f/u US    Chief Complaint  Patient presents with  . Abdominal Pain     OB History    Grav Para Term Preterm Abortions TAB SAB Ect Mult Living   3 1 1  1  0    1      Past Medical History  Diagnosis Date  . Bacterial vaginosis   . Urinary tract infection   . Chlamydia   . Infection     UTI X 1  . Infection 2009    GC  . Infection     YEAST OCC  . Infection     FREQUENT BV  . Infection 2001    CHLAMYDIA    Past Surgical History  Procedure Date  . Induced abortion     Family History  Problem Relation Age of Onset  . Anesthesia problems Neg Hx   . Alcohol abuse Mother   . Asthma Father   . Asthma Paternal Aunt   . Other Maternal Grandmother     STOMACH ULCER  . Cancer Paternal Grandfather     PROSTATE  . Diabetes Paternal Grandfather     History  Substance Use Topics  . Smoking status: Former Smoker    Types: Cigarettes    Quit date: 01/13/2011  . Smokeless tobacco: Never Used   Comment: 2008  . Alcohol Use: 3.5 oz/week    7 drink(s) per week     occasional-not with preg; WINE DAILY ; D/'C'D 12/2011    Allergies: No Known  Allergies  Prescriptions prior to admission  Medication Sig Dispense Refill  . Prenatal Vit-Fe Fumarate-FA (PRENATAL MULTIVITAMIN) 60-1 MG tablet Take 1 tablet by mouth daily.  30 tablet  4     Physical Exam   Blood pressure 114/71, pulse 96, temperature 98.4 F (36.9 C), temperature source Oral, resp. rate 18, height 5\' 9"  (1.753 m), weight 177 lb 12.8 oz (80.65 kg), last menstrual period 11/10/2011.  Chest clear Heart RRR without murmur Abd gravid, NT Back--negative CVAT Pelvic--small amount white d/c in vault. Cervix closed and long, firm. Ext WNL  FHR 150 per doppler No contractions.  Results for orders placed during the hospital encounter of 02/28/12 (from the past 24 hour(s))  URINALYSIS, ROUTINE W REFLEX MICROSCOPIC     Status: Normal   Collection Time   02/28/12  1:40 PM      Component Value Range   Color, Urine YELLOW  YELLOW   APPearance CLEAR  CLEAR   Specific Gravity, Urine 1.020  1.005 - 1.030   pH 7.0  5.0 - 8.0   Glucose, UA NEGATIVE  NEGATIVE mg/dL   Hgb urine dipstick NEGATIVE  NEGATIVE  Bilirubin Urine NEGATIVE  NEGATIVE   Ketones, ur NEGATIVE  NEGATIVE mg/dL   Protein, ur NEGATIVE  NEGATIVE mg/dL   Urobilinogen, UA 1.0  0.0 - 1.0 mg/dL   Nitrite NEGATIVE  NEGATIVE   Leukocytes, UA NEGATIVE  NEGATIVE  WET PREP, GENITAL     Status: Abnormal   Collection Time   02/28/12  2:39 PM      Component Value Range   Yeast Wet Prep HPF POC NONE SEEN  NONE SEEN   Trich, Wet Prep NONE SEEN  NONE SEEN   Clue Cells Wet Prep HPF POC FEW (*) NONE SEEN   WBC, Wet Prep HPF POC FEW (*) NONE SEEN     ED Course  IUP at 15 5/7 weeks Normal pelvic pressure Recent exposure to chlamydia  Plan: Zithromax 1 gm dose today. GC, chlamydia done--will f/u with patient regarding results. Rx for patient' partner, Phineas Real (DOB 04/12/82) for Azithromycin 1 gm dose to ensure no repeat exposure of patient to chlamydia.  He is allergic to PCN.  I wrote out the Rx for patient to  take to him. Patient to f/u at [facility] as scheduled or prn.   Nigel Bridgeman CNM, [place] 02/28/2012 3:13 PM

## 2012-02-28 NOTE — MAU Note (Signed)
Pt reports having increased abd/pelvic pressure especially after standing up/ changing  position.

## 2012-02-28 NOTE — ED Notes (Signed)
Called in lobby. No answer. 

## 2012-02-28 NOTE — MAU Note (Signed)
"  I've had this pain in my lower middle abd for the past month.  I told my doctor and he said it was all right and I probably needed to use the BR.  I've had no constipation.  No vaginal bleeding or abnormal vaginal d/c."

## 2012-03-01 LAB — GC/CHLAMYDIA PROBE AMP, GENITAL: Chlamydia, DNA Probe: POSITIVE — AB

## 2012-03-21 ENCOUNTER — Ambulatory Visit (INDEPENDENT_AMBULATORY_CARE_PROVIDER_SITE_OTHER): Payer: Medicaid Other | Admitting: Obstetrics and Gynecology

## 2012-03-21 ENCOUNTER — Ambulatory Visit (INDEPENDENT_AMBULATORY_CARE_PROVIDER_SITE_OTHER): Payer: Medicaid Other

## 2012-03-21 VITALS — BP 100/64 | Wt 179.0 lb

## 2012-03-21 DIAGNOSIS — N898 Other specified noninflammatory disorders of vagina: Secondary | ICD-10-CM

## 2012-03-21 DIAGNOSIS — Z3689 Encounter for other specified antenatal screening: Secondary | ICD-10-CM

## 2012-03-21 DIAGNOSIS — Z331 Pregnant state, incidental: Secondary | ICD-10-CM

## 2012-03-21 MED ORDER — TERCONAZOLE 80 MG VA SUPP: 80.0000 mg | Freq: Every day | VAGINAL | Status: DC

## 2012-03-21 NOTE — Progress Notes (Signed)
[redacted]w[redacted]d Ultrasound: Single gestation, normal fluid, normal anatomy, female, succenturiate lobe, marginal cord insertion, 19 weeks and 5 days (66 percentile), cervix 4.42 cm. Repeat ultrasound in 8 weeks. Declines AFP. Wet prep: yeast. Patient told of positive Chlamydia.  Gonorrhea negative.  Patient and partner treated with azithromycin.  Needs test of cure in 2 weeks.condoms recommended. Terazol vaginal suppositories each day.  X3. Return office in 2 weeks. Dr. Stefano Gaul

## 2012-03-21 NOTE — Progress Notes (Signed)
C/O Vaginal discharge possible yeast infection x 3-4 days.

## 2012-03-24 LAB — US OB COMP + 14 WK

## 2012-04-04 ENCOUNTER — Encounter: Payer: Self-pay | Admitting: Obstetrics and Gynecology

## 2012-04-04 ENCOUNTER — Ambulatory Visit (INDEPENDENT_AMBULATORY_CARE_PROVIDER_SITE_OTHER): Payer: Medicaid Other | Admitting: Obstetrics and Gynecology

## 2012-04-04 VITALS — BP 102/60 | Wt 182.0 lb

## 2012-04-04 DIAGNOSIS — A749 Chlamydial infection, unspecified: Secondary | ICD-10-CM

## 2012-04-04 DIAGNOSIS — Z331 Pregnant state, incidental: Secondary | ICD-10-CM

## 2012-04-04 DIAGNOSIS — IMO0002 Reserved for concepts with insufficient information to code with codable children: Secondary | ICD-10-CM

## 2012-04-04 DIAGNOSIS — Z3490 Encounter for supervision of normal pregnancy, unspecified, unspecified trimester: Secondary | ICD-10-CM

## 2012-04-04 NOTE — Progress Notes (Signed)
[redacted]w[redacted]d Marginal insertion of cord . Scheduled a f/u for 2 weeks time. Had positive Chlamydia 14 days ago TOC today.

## 2012-04-04 NOTE — Progress Notes (Signed)
[redacted]w[redacted]d Pt has concerns about another u/s

## 2012-04-05 LAB — GC/CHLAMYDIA PROBE AMP, GENITAL
Chlamydia, DNA Probe: NEGATIVE
GC Probe Amp, Genital: NEGATIVE

## 2012-04-19 ENCOUNTER — Ambulatory Visit (INDEPENDENT_AMBULATORY_CARE_PROVIDER_SITE_OTHER): Payer: Medicaid Other | Admitting: Obstetrics and Gynecology

## 2012-04-19 ENCOUNTER — Ambulatory Visit (INDEPENDENT_AMBULATORY_CARE_PROVIDER_SITE_OTHER): Payer: Medicaid Other

## 2012-04-19 VITALS — BP 110/60 | Wt 184.0 lb

## 2012-04-19 DIAGNOSIS — IMO0001 Reserved for inherently not codable concepts without codable children: Secondary | ICD-10-CM

## 2012-04-19 DIAGNOSIS — A749 Chlamydial infection, unspecified: Secondary | ICD-10-CM

## 2012-04-19 DIAGNOSIS — Z3689 Encounter for other specified antenatal screening: Secondary | ICD-10-CM

## 2012-04-19 DIAGNOSIS — IMO0002 Reserved for concepts with insufficient information to code with codable children: Secondary | ICD-10-CM

## 2012-04-19 LAB — US OB FOLLOW UP

## 2012-04-19 NOTE — Progress Notes (Signed)
Pt declines Flu Vaccine Pt need refill for PNV

## 2012-04-19 NOTE — Progress Notes (Signed)
Doing well--TOC negative on 04/04/12. [place] today for f/u on marginal insertion:  Marginal insertion vs velamentous insertion, ? Accessory lobe. Normal growth and fluid.  Cervical length WNL Reviewed findings and plan for f/u US q 4 weeks. Glucola, Hgb, RPR NV.

## 2012-04-20 NOTE — Progress Notes (Signed)
[place] official results: 23 4/7 weeks, EDC 08/12/12, c/w dating. EFW 1+7, 61%ile. Cervix 3.56. Posterior placenta.  Lobe of placenta seen on anterior wall, left uterus. Appears to be marginal vs velamentous cord insertion coming from a portion of the anterior lobe (? Of succenturiate lobe), with umbilical vessels exiting a portion of the main posterior body of the placenta. Will ask AR to review [place] results from today and offer recommendations on plan of care. Anticipate continuing plan for growth [place] q 4 weeks.

## 2012-05-17 ENCOUNTER — Ambulatory Visit (INDEPENDENT_AMBULATORY_CARE_PROVIDER_SITE_OTHER): Payer: Medicaid Other

## 2012-05-17 ENCOUNTER — Ambulatory Visit: Payer: Medicaid Other

## 2012-05-17 ENCOUNTER — Other Ambulatory Visit: Payer: Self-pay | Admitting: Obstetrics and Gynecology

## 2012-05-17 ENCOUNTER — Encounter: Payer: Self-pay | Admitting: Obstetrics and Gynecology

## 2012-05-17 ENCOUNTER — Ambulatory Visit (INDEPENDENT_AMBULATORY_CARE_PROVIDER_SITE_OTHER): Payer: Medicaid Other | Admitting: Obstetrics and Gynecology

## 2012-05-17 VITALS — BP 120/60 | Wt 187.0 lb

## 2012-05-17 DIAGNOSIS — IMO0002 Reserved for concepts with insufficient information to code with codable children: Secondary | ICD-10-CM

## 2012-05-17 DIAGNOSIS — IMO0001 Reserved for inherently not codable concepts without codable children: Secondary | ICD-10-CM

## 2012-05-17 DIAGNOSIS — Z3689 Encounter for other specified antenatal screening: Secondary | ICD-10-CM

## 2012-05-17 DIAGNOSIS — Z331 Pregnant state, incidental: Secondary | ICD-10-CM

## 2012-05-17 LAB — US OB FOLLOW UP

## 2012-05-17 LAB — HEMOGLOBIN: Hemoglobin: 11.2 g/dL — ABNORMAL LOW (ref 12.0–15.0)

## 2012-05-17 NOTE — Patient Instructions (Addendum)
Preterm Labor Preterm labor is when labor starts at less than 37 weeks of pregnancy. The normal length of a pregnancy is 39 to 41 weeks. CAUSES Often, there is no identifiable underlying cause as to why a woman goes into preterm labor. However, one of the most common known causes of preterm labor is infection. Infections of the uterus, cervix, vagina, amniotic sac, bladder, kidney, or even the lungs (pneumonia) can cause labor to start. Other causes of preterm labor include:  Urogenital infections, such as yeast infections and bacterial vaginosis.  Uterine abnormalities (uterine shape, uterine septum, fibroids, bleeding from the placenta).  A cervix that has been operated on and opens prematurely.  Malformations in the baby.  Multiple gestations (twins, triplets, and so on).  Breakage of the amniotic sac. Additional risk factors for preterm labor include:  Previous history of preterm labor.  Premature rupture of membranes (PROM).  A placenta that covers the opening of the cervix (placenta previa).  A placenta that separates from the uterus (placenta abruption).  A cervix that is too weak to hold the baby in the uterus (incompetence cervix).  Having too much fluid in the amniotic sac (polyhydramnios).  Taking illegal drugs or smoking while pregnant.  Not gaining enough weight while pregnant.  Women younger than 18 and older than 28 years old.  Low socioeconomic status.  African-American ethnicity. SYMPTOMS Signs and symptoms of preterm labor include:  Menstrual-like cramps.  Contractions that are 30 to 70 seconds apart, become very regular, closer together, and are more intense and painful.  Contractions that start on the top of the uterus and spread down to the lower abdomen and back.  A sense of increased pelvic pressure or back pain.  A watery or bloody discharge that comes from the vagina. DIAGNOSIS  A diagnosis can be confirmed by:  A vaginal exam.  An  ultrasound of the cervix.  Sampling (swabbing) cervico-vaginal secretions. These samples can be tested for the presence of fetal fibronectin. This is a protein found in cervical discharge which is associated with preterm labor.  Fetal monitoring. TREATMENT  Depending on the length of the pregnancy and other circumstances, a caregiver may suggest bed rest. If necessary, there are medicines that can be given to stop contractions and to quicken fetal lung maturity. If labor happens before 34 weeks of pregnancy, a prolonged hospital stay may be recommended. Treatment depends on the condition of both the mother and baby. PREVENTION There are some things a mother can do to lower the risk of preterm labor in future pregnancies. A woman can:   Stop smoking.  Maintain healthy weight gain and avoid chemicals and drugs that are not necessary.  Be watchful for any type of infection.  Inform her caregiver if she has a known history of preterm labor. Document Released: 09/12/2003 Document Revised: 09/14/2011 Document Reviewed: 10/17/2010 Exit[employer] Patient Information 2013 [employer].   Fetal Movement Counts Patient Name: __________________________________________________ Patient Due Date: ____________________ Kick counts is highly recommended in high risk pregnancies, but it is a good idea for every pregnant woman to do. Start counting fetal movements at 28 weeks of the pregnancy. Fetal movements increase after eating a full meal or eating or drinking something sweet (the blood sugar is higher). It is also important to drink plenty of fluids (well hydrated) before doing the count. Lie on your left side because it helps with the circulation or you can sit in a comfortable chair with your arms over your belly (abdomen) with no   distractions around you. DOING THE COUNT  Try to do the count the same time of day each time you do it.  Mark the day and time, then see how long it takes for you to feel 10  movements (kicks, flutters, swishes, rolls). You should have at least 10 movements within 2 hours. You will most likely feel 10 movements in much less than 2 hours. If you do not, wait an hour and count again. After a couple of days you will see a pattern.  What you are looking for is a change in the pattern or not enough counts in 2 hours. Is it taking longer in time to reach 10 movements? SEEK MEDICAL CARE IF:  You feel less than 10 counts in 2 hours. Tried twice.  No movement in one hour.  The pattern is changing or taking longer each day to reach 10 counts in 2 hours.  You feel the baby is not moving as it usually does. Date: ____________ Movements: ____________ Start time: ____________ Finish time: ____________  Date: ____________ Movements: ____________ Start time: ____________ Finish time: ____________ Date: ____________ Movements: ____________ Start time: ____________ Finish time: ____________ Date: ____________ Movements: ____________ Start time: ____________ Finish time: ____________ Date: ____________ Movements: ____________ Start time: ____________ Finish time: ____________ Date: ____________ Movements: ____________ Start time: ____________ Finish time: ____________ Date: ____________ Movements: ____________ Start time: ____________ Finish time: ____________ Date: ____________ Movements: ____________ Start time: ____________ Finish time: ____________  Date: ____________ Movements: ____________ Start time: ____________ Finish time: ____________ Date: ____________ Movements: ____________ Start time: ____________ Finish time: ____________ Date: ____________ Movements: ____________ Start time: ____________ Finish time: ____________ Date: ____________ Movements: ____________ Start time: ____________ Finish time: ____________ Date: ____________ Movements: ____________ Start time: ____________ Finish time: ____________ Date: ____________ Movements: ____________ Start time: ____________  Finish time: ____________ Date: ____________ Movements: ____________ Start time: ____________ Finish time: ____________  Date: ____________ Movements: ____________ Start time: ____________ Finish time: ____________ Date: ____________ Movements: ____________ Start time: ____________ Finish time: ____________ Date: ____________ Movements: ____________ Start time: ____________ Finish time: ____________ Date: ____________ Movements: ____________ Start time: ____________ Finish time: ____________ Date: ____________ Movements: ____________ Start time: ____________ Finish time: ____________ Date: ____________ Movements: ____________ Start time: ____________ Finish time: ____________ Date: ____________ Movements: ____________ Start time: ____________ Finish time: ____________  Date: ____________ Movements: ____________ Start time: ____________ Finish time: ____________ Date: ____________ Movements: ____________ Start time: ____________ Finish time: ____________ Date: ____________ Movements: ____________ Start time: ____________ Finish time: ____________ Date: ____________ Movements: ____________ Start time: ____________ Finish time: ____________ Date: ____________ Movements: ____________ Start time: ____________ Finish time: ____________ Date: ____________ Movements: ____________ Start time: ____________ Finish time: ____________ Date: ____________ Movements: ____________ Start time: ____________ Finish time: ____________  Date: ____________ Movements: ____________ Start time: ____________ Finish time: ____________ Date: ____________ Movements: ____________ Start time: ____________ Finish time: ____________ Date: ____________ Movements: ____________ Start time: ____________ Finish time: ____________ Date: ____________ Movements: ____________ Start time: ____________ Finish time: ____________ Date: ____________ Movements: ____________ Start time: ____________ Finish time: ____________ Date: ____________  Movements: ____________ Start time: ____________ Finish time: ____________ Date: ____________ Movements: ____________ Start time: ____________ Finish time: ____________  Date: ____________ Movements: ____________ Start time: ____________ Finish time: ____________ Date: ____________ Movements: ____________ Start time: ____________ Finish time: ____________ Date: ____________ Movements: ____________ Start time: ____________ Finish time: ____________ Date: ____________ Movements: ____________ Start time: ____________ Finish time: ____________ Date: ____________ Movements: ____________ Start time: ____________ Finish time: ____________ Date: ____________ Movements: ____________ Start time: ____________ Finish time: ____________ Date: ____________ Movements: ____________   Start time: ____________ Finish time: ____________  Date: ____________ Movements: ____________ Start time: ____________ Finish time: ____________ Date: ____________ Movements: ____________ Start time: ____________ Finish time: ____________ Date: ____________ Movements: ____________ Start time: ____________ Finish time: ____________ Date: ____________ Movements: ____________ Start time: ____________ Finish time: ____________ Date: ____________ Movements: ____________ Start time: ____________ Finish time: ____________ Date: ____________ Movements: ____________ Start time: ____________ Finish time: ____________ Date: ____________ Movements: ____________ Start time: ____________ Finish time: ____________  Date: ____________ Movements: ____________ Start time: ____________ Finish time: ____________ Date: ____________ Movements: ____________ Start time: ____________ Finish time: ____________ Date: ____________ Movements: ____________ Start time: ____________ Finish time: ____________ Date: ____________ Movements: ____________ Start time: ____________ Finish time: ____________ Date: ____________ Movements: ____________ Start time:  ____________ Finish time: ____________ Date: ____________ Movements: ____________ Start time: ____________ Finish time: ____________ Document Released: 07/22/2006 Document Revised: 09/14/2011 Document Reviewed: 01/22/2009 [employer] Patient Information 2013 [employer].  

## 2012-05-17 NOTE — Addendum Note (Signed)
Addended by: Janeece Agee on: 05/17/2012 03:59 PM   Modules accepted: Orders

## 2012-05-17 NOTE — Progress Notes (Signed)
[redacted]w[redacted]d 1 gtt given today w/o difficulty  Unsure peds list given Unsure BC option list given Declines flu vaccine today  Growth u/s today marg cord  EFW 57th%tile Posterior placenta. There is a portion of placenta on the anterior wall ? succenturiate lobe  Cord vessels seen main body of placenta and exciting anterior lobe Velamentous insertion? WNL fluid fluid afi 55th%tile

## 2012-05-17 NOTE — Progress Notes (Signed)
[redacted]w[redacted]d [place] for growth S=D EFW 57% ?sucenturiate lobe, marginal insertion vs. Velamentous  Plan growth in 4wks,  rv'd w Dr Su Hilt, plan growth [place] q4wks, antenatal testing recommended if growth decreases or decreased fluid is noted  rv'd [facility] and PTL RTO 2wks

## 2012-05-18 LAB — RPR

## 2012-05-18 LAB — GLUCOSE TOLERANCE, 1 HOUR (50G) W/O FASTING: Glucose, 1 Hour GTT: 118 mg/dL (ref 70–140)

## 2012-05-31 ENCOUNTER — Ambulatory Visit (INDEPENDENT_AMBULATORY_CARE_PROVIDER_SITE_OTHER): Payer: Medicaid Other | Admitting: Obstetrics and Gynecology

## 2012-05-31 ENCOUNTER — Encounter: Payer: Self-pay | Admitting: Obstetrics and Gynecology

## 2012-05-31 VITALS — BP 110/54 | Wt 189.0 lb

## 2012-05-31 DIAGNOSIS — Z331 Pregnant state, incidental: Secondary | ICD-10-CM

## 2012-05-31 DIAGNOSIS — N898 Other specified noninflammatory disorders of vagina: Secondary | ICD-10-CM

## 2012-05-31 DIAGNOSIS — Z23 Encounter for immunization: Secondary | ICD-10-CM

## 2012-05-31 NOTE — Addendum Note (Signed)
Addended by: Tim Lair on: 05/31/2012 12:25 PM   Modules accepted: Orders

## 2012-05-31 NOTE — Progress Notes (Signed)
Pt c/o increased discharge, clear.

## 2012-05-31 NOTE — Progress Notes (Signed)
[redacted]w[redacted]d Glucola 118.  Hemoglobin 11.2.  RPR nonreactive. Iron tablets and iron rich foods discussed. Return to office in 2 weeks. Wet prep negative Flu shot today. Dr. Stefano Gaul

## 2012-06-07 ENCOUNTER — Telehealth: Payer: Self-pay | Admitting: Obstetrics and Gynecology

## 2012-06-07 NOTE — Telephone Encounter (Signed)
Tc to pt regarding msg.  Pt states had a wet prep at last visit and wanted to know if there was anything detected.  Informed pt per AVS wet prep was negative, pt voices agreement.

## 2012-06-14 ENCOUNTER — Encounter: Payer: Self-pay | Admitting: Obstetrics and Gynecology

## 2012-06-14 ENCOUNTER — Ambulatory Visit (INDEPENDENT_AMBULATORY_CARE_PROVIDER_SITE_OTHER): Payer: Medicaid Other | Admitting: Obstetrics and Gynecology

## 2012-06-14 VITALS — BP 104/60 | Wt 189.0 lb

## 2012-06-14 DIAGNOSIS — Z331 Pregnant state, incidental: Secondary | ICD-10-CM

## 2012-06-14 DIAGNOSIS — N899 Noninflammatory disorder of vagina, unspecified: Secondary | ICD-10-CM

## 2012-06-14 DIAGNOSIS — O43129 Velamentous insertion of umbilical cord, unspecified trimester: Secondary | ICD-10-CM

## 2012-06-14 DIAGNOSIS — N898 Other specified noninflammatory disorders of vagina: Secondary | ICD-10-CM

## 2012-06-14 LAB — POCT WET PREP (WET MOUNT)
Clue Cells Wet Prep Whiff POC: NEGATIVE
Trichomonas Wet Prep HPF POC: NEGATIVE
pH: 4.5

## 2012-06-14 MED ORDER — NYSTATIN-TRIAMCINOLONE 100000-0.1 UNIT/GM-% EX CREA: TOPICAL_CREAM | Freq: Four times a day (QID) | CUTANEOUS | Status: DC

## 2012-06-14 NOTE — Progress Notes (Signed)
[redacted]w[redacted]d  C/O: Vaginal irritation. Pt stated she was suppose to have U/S done to check the size of her baby.

## 2012-06-14 NOTE — Progress Notes (Signed)
[redacted]w[redacted]d Erythema and thinning of the skin noted at the posterior introitus. Prep: PH 4.5.Whiff negative.  Yeast present.  Negative clue cells.  Negative trichomoniasis. Mycolog-II cream to the introitus.  Use hair drier to blow dry the peritoneum after her bath. Growth ultrasound next visit because of a history of a velamentous insertion of the umbilical cord. Return to office in 2 weeks. Dr. Stefano Gaul

## 2012-06-28 ENCOUNTER — Ambulatory Visit (INDEPENDENT_AMBULATORY_CARE_PROVIDER_SITE_OTHER): Payer: Medicaid Other

## 2012-06-28 ENCOUNTER — Encounter: Payer: Self-pay | Admitting: Obstetrics and Gynecology

## 2012-06-28 ENCOUNTER — Ambulatory Visit (INDEPENDENT_AMBULATORY_CARE_PROVIDER_SITE_OTHER): Payer: Medicaid Other | Admitting: Obstetrics and Gynecology

## 2012-06-28 VITALS — BP 112/60 | Wt 187.0 lb

## 2012-06-28 DIAGNOSIS — O43129 Velamentous insertion of umbilical cord, unspecified trimester: Secondary | ICD-10-CM

## 2012-06-28 DIAGNOSIS — Z331 Pregnant state, incidental: Secondary | ICD-10-CM

## 2012-06-28 NOTE — Progress Notes (Signed)
[redacted]w[redacted]d GFM Ultrasound:AGA with normal AFI  Velamentous cord with possible succenturiate lobe. Repeat ultrasound at 37 weeks

## 2012-06-28 NOTE — Progress Notes (Signed)
Ultrasound shows:  SIUP  S=D     [place] EDD: 08/12/12            EFW: 4 lbs 12 34.9%           AFI: 12.73            Cervical length: 4.37 cm           Placenta localization: posterior           Fetal presentation: vertex There is an anterior portion of placenta has raises the question of a succenturiate lob Velamentous cord insertion is again suggested.

## 2012-06-28 NOTE — Progress Notes (Signed)
[redacted]w[redacted]d Pt has no complaints

## 2012-06-30 ENCOUNTER — Other Ambulatory Visit: Payer: Self-pay | Admitting: Obstetrics and Gynecology

## 2012-06-30 DIAGNOSIS — O43129 Velamentous insertion of umbilical cord, unspecified trimester: Secondary | ICD-10-CM

## 2012-07-01 LAB — US OB FOLLOW UP

## 2012-07-13 ENCOUNTER — Encounter: Payer: Medicaid Other | Admitting: Obstetrics and Gynecology

## 2012-07-14 ENCOUNTER — Ambulatory Visit (INDEPENDENT_AMBULATORY_CARE_PROVIDER_SITE_OTHER): Payer: Medicaid Other | Admitting: Obstetrics and Gynecology

## 2012-07-14 ENCOUNTER — Encounter: Payer: Medicaid Other | Admitting: Obstetrics and Gynecology

## 2012-07-14 VITALS — BP 100/56 | Wt 192.0 lb

## 2012-07-14 DIAGNOSIS — O43129 Velamentous insertion of umbilical cord, unspecified trimester: Secondary | ICD-10-CM

## 2012-07-14 DIAGNOSIS — O283 Abnormal ultrasonic finding on antenatal screening of mother: Secondary | ICD-10-CM

## 2012-07-14 DIAGNOSIS — Z331 Pregnant state, incidental: Secondary | ICD-10-CM

## 2012-07-14 LAB — POCT WET PREP (WET MOUNT): Bacteria Wet Prep HPF POC: NEGATIVE

## 2012-07-14 NOTE — Progress Notes (Signed)
Pt stated no issues today.  

## 2012-07-14 NOTE — Progress Notes (Signed)
Doing well. Still has some external irritation--started on nystatin cream at last visit with AVS.  Not using cream regularly. Pelvic--external irritation, appears c/w external yeast/moisture issue.   Wet prep negative, leukorrhea noted. GBS, cultures today. Perineal hygiene reviewed--will CTO. Some pain in pubic symphysis--comfort measures and normalcy of sx reviewed. Vtx to [facility]. [place] for growth and fluid due to velamentous insertion and possible succenturiate lobe.

## 2012-07-15 LAB — GC/CHLAMYDIA PROBE AMP: GC Probe RNA: NEGATIVE

## 2012-07-16 LAB — STREP B DNA PROBE: GBSP: POSITIVE

## 2012-07-18 ENCOUNTER — Encounter: Payer: Self-pay | Admitting: Obstetrics and Gynecology

## 2012-07-18 DIAGNOSIS — O99820 Streptococcus B carrier state complicating pregnancy: Secondary | ICD-10-CM | POA: Insufficient documentation

## 2012-07-21 ENCOUNTER — Encounter: Payer: Medicaid Other | Admitting: *Deleted

## 2012-07-22 ENCOUNTER — Other Ambulatory Visit: Payer: Self-pay | Admitting: Obstetrics and Gynecology

## 2012-07-22 ENCOUNTER — Encounter: Payer: Self-pay | Admitting: Obstetrics and Gynecology

## 2012-07-22 ENCOUNTER — Ambulatory Visit: Payer: Medicaid Other | Admitting: Obstetrics and Gynecology

## 2012-07-22 ENCOUNTER — Ambulatory Visit: Payer: Medicaid Other

## 2012-07-22 VITALS — BP 106/64 | Wt 191.0 lb

## 2012-07-22 DIAGNOSIS — O43129 Velamentous insertion of umbilical cord, unspecified trimester: Secondary | ICD-10-CM

## 2012-07-22 NOTE — Progress Notes (Signed)
[redacted]w[redacted]d  GFM Ultrasound: AGA with normal AFI GBS + reviewed: booklet given

## 2012-07-22 NOTE — Progress Notes (Signed)
[redacted]w[redacted]d Pt has no complaints  Pt wants cervix checked  

## 2012-07-22 NOTE — Progress Notes (Signed)
Ultrasound shows:  SIUP  S=D     [place] EDD: 08/12/12          EFW: 6 lbs 8 oz 58th%           AFI: 12.3           Cervical length: not measured           Placenta localization: posterior           Fetal presentation: vertex Comments: Anterior lobe of placenta is again seen. Findings suggestive of an anterior succenturiate lobe. hypocoiled cord and marginal vs velamentous cor is again visualized.  Normal fluid Normal linear growth.

## 2012-07-25 LAB — US OB FOLLOW UP

## 2012-07-28 ENCOUNTER — Ambulatory Visit: Payer: Medicaid Other | Admitting: Obstetrics and Gynecology

## 2012-07-28 VITALS — BP 100/62 | Wt 191.0 lb

## 2012-07-28 DIAGNOSIS — Z331 Pregnant state, incidental: Secondary | ICD-10-CM

## 2012-07-28 NOTE — Progress Notes (Signed)
[redacted]w[redacted]d  Pt c/o: lower abdomen pain.  Pt desires cervix check.

## 2012-07-28 NOTE — Progress Notes (Signed)
Patient ID: Tiffany Wilkinson, female   DOB: 1986/02/15, 27 y.o.   MRN: [id number] [redacted]w[redacted]d Baby hugger at [facility] discussed. Reviewed s/s labor uc q 5 minutes or closer and becoming more intense over time, srom, vag bleeding, bloody mucus is normal,  bright red like you cut your finger is an emergency, daily fetal kick  10 x in 2 hours counts if less report, comfort measures discussed.  Encouraged 8 water daily and frequent voids. [name], CNM

## 2012-08-01 ENCOUNTER — Encounter (HOSPITAL_COMMUNITY): Payer: Self-pay | Admitting: *Deleted

## 2012-08-01 ENCOUNTER — Encounter (HOSPITAL_COMMUNITY): Payer: Self-pay | Admitting: Anesthesiology

## 2012-08-01 ENCOUNTER — Inpatient Hospital Stay (HOSPITAL_COMMUNITY): Payer: Medicaid Other | Admitting: Anesthesiology

## 2012-08-01 ENCOUNTER — Inpatient Hospital Stay (HOSPITAL_COMMUNITY)
Admission: AD | Admit: 2012-08-01 | Discharge: 2012-08-03 | DRG: 775 | Disposition: A | Discharge status: Home / Self Care | Payer: Medicaid Other | Source: Ambulatory Visit | Attending: Obstetrics and Gynecology | Admitting: Obstetrics and Gynecology

## 2012-08-01 DIAGNOSIS — O99892 Other specified diseases and conditions complicating childbirth: Principal | ICD-10-CM | POA: Diagnosis present

## 2012-08-01 DIAGNOSIS — Z22330 Carrier of Group B streptococcus: Secondary | ICD-10-CM

## 2012-08-01 LAB — CBC
HCT: 40.7 % (ref 36.0–46.0)
MCHC: 34.4 g/dL (ref 30.0–36.0)
MCV: 87.5 fL (ref 78.0–100.0)
Platelets: 269 10*3/uL (ref 150–400)
RDW: 12.7 % (ref 11.5–15.5)
WBC: 17.3 10*3/uL — ABNORMAL HIGH (ref 4.0–10.5)

## 2012-08-01 MED ORDER — OXYCODONE-ACETAMINOPHEN 5-325 MG PO TABS: 1.0000 | ORAL_TABLET | ORAL | Status: DC | PRN

## 2012-08-01 MED ORDER — TETANUS-DIPHTH-ACELL PERTUSSIS 5-2.5-18.5 LF-MCG/0.5 IM SUSP
0.5000 mL | Freq: Once | INTRAMUSCULAR | Status: AC
  Administered 2012-08-02: 0.5 mL via INTRAMUSCULAR
  Filled 2012-08-01: qty 0.5

## 2012-08-01 MED ORDER — OXYTOCIN 40 UNITS IN LACTATED RINGERS INFUSION - SIMPLE MED
62.5000 mL/h | INTRAVENOUS | Status: DC
  Filled 2012-08-01: qty 1000

## 2012-08-01 MED ORDER — PENICILLIN G POTASSIUM 5000000 UNITS IJ SOLR
5.0000 10*6.[IU] | Freq: Once | INTRAVENOUS | Status: AC
  Administered 2012-08-01: 5 10*6.[IU] via INTRAVENOUS
  Filled 2012-08-01: qty 5

## 2012-08-01 MED ORDER — WITCH HAZEL-GLYCERIN EX PADS: 1.0000 "application " | MEDICATED_PAD | CUTANEOUS | Status: DC | PRN

## 2012-08-01 MED ORDER — SENNOSIDES-DOCUSATE SODIUM 8.6-50 MG PO TABS
2.0000 | ORAL_TABLET | Freq: Every day | ORAL | Status: DC
  Administered 2012-08-01 – 2012-08-02 (×2): 2 via ORAL

## 2012-08-01 MED ORDER — DIPHENHYDRAMINE HCL 25 MG PO CAPS: 25.0000 mg | ORAL_CAPSULE | Freq: Four times a day (QID) | ORAL | Status: DC | PRN

## 2012-08-01 MED ORDER — PHENYLEPHRINE 40 MCG/ML (10ML) SYRINGE FOR IV PUSH (FOR BLOOD PRESSURE SUPPORT): 80.0000 ug | PREFILLED_SYRINGE | INTRAVENOUS | Status: DC | PRN

## 2012-08-01 MED ORDER — ONDANSETRON HCL 4 MG/2ML IJ SOLN: 4.0000 mg | Freq: Four times a day (QID) | INTRAMUSCULAR | Status: DC | PRN

## 2012-08-01 MED ORDER — FENTANYL 2.5 MCG/ML BUPIVACAINE 1/10 % EPIDURAL INFUSION (WH - ANES)
INTRAMUSCULAR | Status: DC | PRN
  Administered 2012-08-01: 14 mL/h via EPIDURAL

## 2012-08-01 MED ORDER — LANOLIN HYDROUS EX OINT: TOPICAL_OINTMENT | CUTANEOUS | Status: DC | PRN

## 2012-08-01 MED ORDER — ACETAMINOPHEN 325 MG PO TABS: 650.0000 mg | ORAL_TABLET | ORAL | Status: DC | PRN

## 2012-08-01 MED ORDER — FENTANYL 2.5 MCG/ML BUPIVACAINE 1/10 % EPIDURAL INFUSION (WH - ANES)
14.0000 mL/h | INTRAMUSCULAR | Status: DC
  Filled 2012-08-01: qty 125

## 2012-08-01 MED ORDER — ONDANSETRON HCL 4 MG PO TABS: 4.0000 mg | ORAL_TABLET | ORAL | Status: DC | PRN

## 2012-08-01 MED ORDER — CITRIC ACID-SODIUM CITRATE 334-500 MG/5ML PO SOLN: 30.0000 mL | ORAL | Status: DC | PRN

## 2012-08-01 MED ORDER — ZOLPIDEM TARTRATE 5 MG PO TABS: 5.0000 mg | ORAL_TABLET | Freq: Every evening | ORAL | Status: DC | PRN

## 2012-08-01 MED ORDER — OXYCODONE-ACETAMINOPHEN 5-325 MG PO TABS
1.0000 | ORAL_TABLET | ORAL | Status: DC | PRN
  Administered 2012-08-01 (×2): 1 via ORAL
  Administered 2012-08-02 – 2012-08-03 (×3): 2 via ORAL
  Filled 2012-08-01: qty 2
  Filled 2012-08-01: qty 1
  Filled 2012-08-01: qty 2
  Filled 2012-08-01 (×3): qty 1

## 2012-08-01 MED ORDER — LACTATED RINGERS IV SOLN: 500.0000 mL | Freq: Once | INTRAVENOUS | Status: DC

## 2012-08-01 MED ORDER — PHENYLEPHRINE 40 MCG/ML (10ML) SYRINGE FOR IV PUSH (FOR BLOOD PRESSURE SUPPORT)
80.0000 ug | PREFILLED_SYRINGE | INTRAVENOUS | Status: DC | PRN
  Filled 2012-08-01: qty 5

## 2012-08-01 MED ORDER — LACTATED RINGERS IV SOLN: INTRAVENOUS | Status: DC

## 2012-08-01 MED ORDER — NALBUPHINE SYRINGE 5 MG/0.5 ML
5.0000 mg | INJECTION | INTRAMUSCULAR | Status: DC | PRN
  Administered 2012-08-01: 5 mg via INTRAVENOUS
  Filled 2012-08-01: qty 0.5

## 2012-08-01 MED ORDER — EPHEDRINE 5 MG/ML INJ: 10.0000 mg | INTRAVENOUS | Status: DC | PRN

## 2012-08-01 MED ORDER — DIPHENHYDRAMINE HCL 50 MG/ML IJ SOLN: 12.5000 mg | INTRAMUSCULAR | Status: DC | PRN

## 2012-08-01 MED ORDER — ONDANSETRON HCL 4 MG/2ML IJ SOLN: 4.0000 mg | INTRAMUSCULAR | Status: DC | PRN

## 2012-08-01 MED ORDER — LIDOCAINE HCL (PF) 1 % IJ SOLN
30.0000 mL | INTRAMUSCULAR | Status: DC | PRN
  Filled 2012-08-01: qty 30

## 2012-08-01 MED ORDER — PENICILLIN G POTASSIUM 5000000 UNITS IJ SOLR
2.5000 10*6.[IU] | INTRAVENOUS | Status: DC
  Filled 2012-08-01 (×4): qty 2.5

## 2012-08-01 MED ORDER — PRENATAL MULTIVITAMIN CH
1.0000 | ORAL_TABLET | Freq: Every day | ORAL | Status: DC
  Administered 2012-08-02 – 2012-08-03 (×2): 1 via ORAL
  Filled 2012-08-01 (×3): qty 1

## 2012-08-01 MED ORDER — EPHEDRINE 5 MG/ML INJ
10.0000 mg | INTRAVENOUS | Status: DC | PRN
  Filled 2012-08-01: qty 4

## 2012-08-01 MED ORDER — DIBUCAINE 1 % RE OINT: 1.0000 "application " | TOPICAL_OINTMENT | RECTAL | Status: DC | PRN

## 2012-08-01 MED ORDER — LACTATED RINGERS IV SOLN: 500.0000 mL | INTRAVENOUS | Status: DC | PRN

## 2012-08-01 MED ORDER — IBUPROFEN 600 MG PO TABS
600.0000 mg | ORAL_TABLET | Freq: Four times a day (QID) | ORAL | Status: DC
  Administered 2012-08-01 – 2012-08-03 (×7): 600 mg via ORAL
  Filled 2012-08-01 (×8): qty 1

## 2012-08-01 MED ORDER — SIMETHICONE 80 MG PO CHEW: 80.0000 mg | CHEWABLE_TABLET | ORAL | Status: DC | PRN

## 2012-08-01 MED ORDER — BENZOCAINE-MENTHOL 20-0.5 % EX AERO
1.0000 "application " | INHALATION_SPRAY | CUTANEOUS | Status: DC | PRN
  Administered 2012-08-01: 1 via TOPICAL
  Filled 2012-08-01: qty 56

## 2012-08-01 MED ORDER — OXYTOCIN BOLUS FROM INFUSION
500.0000 mL | INTRAVENOUS | Status: DC
  Administered 2012-08-01: 500 mL via INTRAVENOUS

## 2012-08-01 MED ORDER — IBUPROFEN 600 MG PO TABS: 600.0000 mg | ORAL_TABLET | Freq: Four times a day (QID) | ORAL | Status: DC | PRN

## 2012-08-01 MED ORDER — LIDOCAINE HCL (PF) 1 % IJ SOLN
INTRAMUSCULAR | Status: DC | PRN
  Administered 2012-08-01 (×2): 8 mL

## 2012-08-01 NOTE — MAU Note (Signed)
uc's since 0530, very painful & intense.  Denies bleeding or LOF.  Active FM.

## 2012-08-01 NOTE — H&P (Signed)
Tiffany Wilkinson is a 27 y.o. female presenting for  Maternal Medical History:  Reason for admission: Reason for admission: contractions and nausea.  Contractions: Onset was 3-5 hours ago.   Frequency: regular.   Perceived severity is moderate.    Fetal activity: Perceived fetal activity is normal.   Last perceived fetal movement was within the past hour.    Prenatal complications: Placental abnormality.     OB History    Grav Para Term Preterm Abortions TAB SAB Ect Mult Living   3 1 1  1  0    1     Past Medical History  Diagnosis Date  . Bacterial vaginosis   . Urinary tract infection   . Chlamydia   . Infection     UTI X 1  . Infection 2009    GC  . Infection     YEAST OCC  . Infection     FREQUENT BV  . Infection 2001    CHLAMYDIA   Past Surgical History  Procedure Date  . Induced abortion    Family History: family history includes Alcohol abuse in her mother; Asthma in her father and paternal aunt; Cancer in her paternal grandfather; Diabetes in her paternal grandfather; Mitral valve prolapse in her maternal grandmother; and Other in her maternal grandmother.  There is no history of Anesthesia problems. Social History:  reports that she quit smoking about 18 months ago. Her smoking use included Cigarettes. She has never used smokeless tobacco. She reports that she drinks about 3.5 ounces of alcohol per week. She reports that she does not use illicit drugs.   Prenatal Transfer Tool  Maternal Diabetes: No Genetic Screening: Normal Maternal Ultrasounds/Referrals: Abnormal:  Findings:   Other: Fetal Ultrasounds or other Referrals:  None Maternal Substance Abuse:  No Significant Maternal Medications:  None Significant Maternal Lab Results:  Lab values include: Group B Strep positive, Other:  positive Chlamydia in the first trimester with subsequent negative test of cure Other Comments:  Question of velamentous cord insertion, and possible succenturiate lobe  Review  of Systems  Constitutional: Negative.   HENT: Negative.   Eyes: Negative.   Respiratory: Negative.   Cardiovascular: Negative.   Gastrointestinal: Positive for nausea.  Genitourinary: Negative.   Musculoskeletal: Negative.   Skin: Negative.   Neurological: Negative.   Endo/Heme/Allergies: Negative.     Dilation: 5.5  ( cx was 3 cm on admission) Effacement (%): 80;90 Station: 0 Exam by:: Dr. Pennie Rushing Blood pressure 118/73, pulse 91, temperature 98 F (36.7 C), temperature source Oral, resp. rate 20, height 5' 8.5" (1.74 m), weight 191 lb (86.637 kg), last menstrual period 11/10/2011, SpO2 100.00%. Maternal Exam:  Uterine Assessment: Contraction strength is moderate.  Contraction frequency is regular.   Abdomen: Patient reports no abdominal tenderness. Fundal height is 38cm.   Estimated fetal weight is 7lb.   Fetal presentation: vertex  Introitus: Normal vulva.   Physical Exam  Constitutional: She is oriented to person, place, and time. She appears well-developed and well-nourished.  HENT:  Head: Normocephalic and atraumatic.  Eyes: EOM are normal.  Neck: Normal range of motion. Neck supple.  Respiratory: Effort normal.  GI: Soft.  Musculoskeletal: Normal range of motion.  Neurological: She is alert and oriented to person, place, and time. She has normal reflexes.  Skin: Skin is warm and dry.  Psychiatric: She has a normal mood and affect.    Prenatal labs: ABO, Rh: A/POS/-- (07/10 1614) Antibody: NEG (07/10 1614) Rubella: 240.1 (07/10 1614) RPR: NON  REAC (11/12 1102)  HBsAg: NEGATIVE (07/10 1614)  HIV: NON REACTIVE (07/10 1614)  GBS: POSITIVE (01/09 1225)   Assessment Pregnancy at 38 weeks and 3 day Active labor History of Chlamydia during this pregnancy with negative test of cure after treatment Positive group B strep  /Plan:  Epidural as desired Antibiotic prophylaxis for group B strep Anticipate vaginal delivery [name] P 08/01/2012, 11:16  AM

## 2012-08-01 NOTE — Op Note (Signed)
Delivery Note At 12:45 PM a viable female was delivered via Vaginal, Spontaneous Delivery (Presentation:vertex  ).  APGAR: 9, 9; weight .6lb 12.3 oz   Placenta status: Intact, Spontaneous Pathology.  Cord:  with the following complications: velamentous insertion.  Cord pH: none         Placenta:  Succinturate lobe.  Sent to pathology Anesthesia: Epidural  Episiotomy: none Lacerations: none Suture Repair: n/a Est. Blood Loss (mL): 250cc  Mom to postpartum.  Baby to nursery-stable.  Tiffany Wilkinson,Tiffany Wilkinson 08/01/2012, 1:20 PM

## 2012-08-01 NOTE — Anesthesia Procedure Notes (Signed)
Epidural Patient location during procedure: OB Start time: 08/01/2012 10:34 AM End time: 08/01/2012 10:39 AM  Staffing Anesthesiologist: Sandrea Hughs  Preanesthetic Checklist Completed: patient identified, site marked, surgical consent, pre-op evaluation, timeout performed, IV checked, risks and benefits discussed and monitors and equipment checked  Epidural Patient position: sitting Prep: site prepped and draped and DuraPrep Patient monitoring: continuous pulse ox and blood pressure Approach: midline Injection technique: LOR air  Needle:  Needle type: Tuohy  Needle gauge: 17 G Needle length: 9 cm and 9 Needle insertion depth: 6 cm Catheter type: closed end flexible Catheter size: 19 Gauge Catheter at skin depth: 11 cm Test dose: negative and Other  Assessment Sensory level: T8 Events: blood not aspirated, injection not painful, no injection resistance, negative IV test and no paresthesia  Additional Notes Reason for block:procedure for pain

## 2012-08-01 NOTE — Anesthesia Preprocedure Evaluation (Signed)

## 2012-08-02 ENCOUNTER — Ambulatory Visit: Payer: Medicaid Other | Admitting: Obstetrics and Gynecology

## 2012-08-02 LAB — CBC
HCT: 36.3 % (ref 36.0–46.0)
Hemoglobin: 12.2 g/dL (ref 12.0–15.0)
MCV: 88.1 fL (ref 78.0–100.0)
RBC: 4.12 MIL/uL (ref 3.87–5.11)
WBC: 17.2 10*3/uL — ABNORMAL HIGH (ref 4.0–10.5)

## 2012-08-02 MED ORDER — MEDROXYPROGESTERONE ACETATE 150 MG/ML IM SUSP: 150.0000 mg | Freq: Once | INTRAMUSCULAR | Status: DC

## 2012-08-02 NOTE — Progress Notes (Signed)
Post Partum Day 1:S/P SVB by Dr. Pennie Rushing, intact perineum Subjective: Patient up ad lib, denies syncope or dizziness.  Reports no sleep since before admission yesterday am.  Peds advised her they would recommend at least 36 hour stay pp due to + GBS, with delivery at 12:45pm yesterday after single dose of PCN.  Patient reports most likely will plan d/c tomorrow am.   Feeding:  Breastfeeding going well. Contraceptive plan:   Plans Depo before d/c.  Objective: Blood pressure 125/80, pulse 77, temperature 98 F (36.7 C), temperature source Oral, resp. rate 18, height 5' 8.5" (1.74 m), weight 191 lb (86.637 kg), last menstrual period 11/10/2011, SpO2 100.00%, unknown if currently breastfeeding.  Physical Exam:  General: alert Lochia: appropriate Uterine Fundus: firm Incision: Intact perineum DVT Evaluation: No evidence of DVT seen on physical exam. Negative Homan's sign.   Basename 08/01/12 0955  HGB 14.0  HCT 40.7  CBC pending for today--Lab went in to draw, but patient was nursing, so they deferred until nursing session completed.  Assessment/Plan: S/P Vaginal delivery day 1 Continue current care Anticipate d/c tomorrow. DepoProvera IM before d/c.   LOS: 1 day   Nigel Bridgeman 08/02/2012, 6:22 AM

## 2012-08-02 NOTE — Anesthesia Postprocedure Evaluation (Signed)
  Anesthesia Post-op Note  Patient: Tiffany Wilkinson  Procedure(s) Performed: * No procedures listed *  Patient Location: PACU  Anesthesia Type:Epidural  Level of Consciousness: awake, alert , oriented and patient cooperative  Airway and Oxygen Therapy: Patient Spontanous Breathing  Post-op Pain: none  Post-op Assessment: Post-op Vital signs reviewed and Patient's Cardiovascular Status Stable  Post-op Vital Signs: Reviewed and stable  Complications: No apparent anesthesia complications

## 2012-08-03 MED ORDER — MEDROXYPROGESTERONE ACETATE 150 MG/ML IM SUSP
150.0000 mg | Freq: Once | INTRAMUSCULAR | Status: AC
  Administered 2012-08-03: 150 mg via INTRAMUSCULAR
  Filled 2012-08-03: qty 1

## 2012-08-03 MED ORDER — IBUPROFEN 600 MG PO TABS
600.0000 mg | ORAL_TABLET | Freq: Four times a day (QID) | ORAL | Status: DC | PRN
Start: 2012-08-03 — End: 2013-06-03

## 2012-08-03 MED ORDER — OXYCODONE-ACETAMINOPHEN 5-325 MG PO TABS: 1.0000 | ORAL_TABLET | Freq: Four times a day (QID) | ORAL | Status: DC | PRN

## 2012-08-04 ENCOUNTER — Encounter: Payer: Medicaid Other | Admitting: Obstetrics and Gynecology

## 2012-08-05 NOTE — Discharge Summary (Signed)
  Vaginal Delivery Discharge Summary  Tiffany Wilkinson  DOB:    [birth year] MRN:    [id number] CSN:    [id number]  Date of admission:                  08/01/12  Date of discharge:                   08/03/12  Procedures this admission: SVB  Newborn Data:  Live born female  Birth Weight: 6 lb 12.3 oz (3070 g) APGAR: 9, 9  Home with mother.  History of Present Illness:  [name] is a 27 y.o. female, [id number], who presents at [redacted]w[redacted]d weeks gestation. The patient has been followed at the [facility] division of [facility]. She was admitted onset of labor. Her pregnancy has been complicated by:  Patient Active Problem List  Diagnosis  . Exposure to STD at 15 weeks  . Unsure of LMP (last menstrual period)  . Increased nuchal translucency space on fetal ultrasound in 1st trimester--resolved on f/u US  . Marginal insertion of umbilical cord vs possible velamentous insertion  . Chlamydia  . GBS (group B Streptococcus carrier), +RV culture, currently pregnant  . Vaginal delivery     Hospital course:  The patient was admitted for labor.   Her labor was not complicated. She proceeded to have a vaginal delivery of a healthy infant. Her delivery was not complicated. Her postpartum course was not complicated. She was discharged to home on postpartum day 2 doing well.  Received Depo on the day of discharge.  Seen by Dr. Normand Sloop on 1/29 and discharged home. D/C summary done by Nigel Bridgeman, CNM  Feeding:  breast  Contraception:  Depo-Provera  Discharge hemoglobin:  Hemoglobin  Date Value Range Status  08/02/2012 12.2  12.0 - 15.0 g/dL Final     HCT  Date Value Range Status  08/02/2012 36.3  36.0 - 46.0 % Final    Discharge Physical Exam:   General: alert Lochia: appropriate Uterine Fundus: firm Incision: NA DVT Evaluation: No evidence of DVT seen on physical exam. Negative Homan's sign.  Intrapartum Procedures:  spontaneous vaginal delivery Postpartum Procedures: none, DepoProvera Complications-Operative and Postpartum: none  Discharge Diagnoses: Term Pregnancy-delivered  Discharge Information:  Activity:           Per [facility] handout Diet:                routine Medications: Ibuprofen, Percocet Condition:      stable Instructions:  refer to practice specific booklet Discharge to: home  Follow-up Information    Follow up with [facility]. In 6 weeks.   Contact information:   [address] [place] [address] [phone]          Nigel Bridgeman 08/05/2012

## 2012-08-11 ENCOUNTER — Encounter: Payer: Medicaid Other | Admitting: Obstetrics and Gynecology

## 2012-10-17 ENCOUNTER — Encounter (HOSPITAL_BASED_OUTPATIENT_CLINIC_OR_DEPARTMENT_OTHER): Payer: Self-pay

## 2012-10-17 ENCOUNTER — Emergency Department (HOSPITAL_BASED_OUTPATIENT_CLINIC_OR_DEPARTMENT_OTHER)
Admission: EM | Admit: 2012-10-17 | Discharge: 2012-10-17 | Disposition: A | Discharge status: Home / Self Care | Payer: Medicaid Other | Attending: Emergency Medicine | Admitting: Emergency Medicine

## 2012-10-17 DIAGNOSIS — B9689 Other specified bacterial agents as the cause of diseases classified elsewhere: Secondary | ICD-10-CM

## 2012-10-17 DIAGNOSIS — Z8619 Personal history of other infectious and parasitic diseases: Secondary | ICD-10-CM | POA: Insufficient documentation

## 2012-10-17 DIAGNOSIS — Z87891 Personal history of nicotine dependence: Secondary | ICD-10-CM | POA: Insufficient documentation

## 2012-10-17 DIAGNOSIS — N760 Acute vaginitis: Secondary | ICD-10-CM | POA: Insufficient documentation

## 2012-10-17 DIAGNOSIS — Z3202 Encounter for pregnancy test, result negative: Secondary | ICD-10-CM | POA: Insufficient documentation

## 2012-10-17 DIAGNOSIS — Z87440 Personal history of urinary (tract) infections: Secondary | ICD-10-CM | POA: Insufficient documentation

## 2012-10-17 LAB — WET PREP, GENITAL
Trich, Wet Prep: NONE SEEN
Yeast Wet Prep HPF POC: NONE SEEN

## 2012-10-17 LAB — URINALYSIS, ROUTINE W REFLEX MICROSCOPIC
Bilirubin Urine: NEGATIVE
Glucose, UA: NEGATIVE mg/dL
Hgb urine dipstick: NEGATIVE
Nitrite: NEGATIVE
Specific Gravity, Urine: 1.023 (ref 1.005–1.030)
pH: 6.5 (ref 5.0–8.0)

## 2012-10-17 MED ORDER — CEFTRIAXONE SODIUM 250 MG IJ SOLR
250.0000 mg | Freq: Once | INTRAMUSCULAR | Status: AC
  Administered 2012-10-17: 250 mg via INTRAMUSCULAR
  Filled 2012-10-17: qty 250

## 2012-10-17 MED ORDER — AZITHROMYCIN 250 MG PO TABS
1000.0000 mg | ORAL_TABLET | Freq: Once | ORAL | Status: AC
  Administered 2012-10-17: 1000 mg via ORAL
  Filled 2012-10-17: qty 4

## 2012-10-17 MED ORDER — METRONIDAZOLE 500 MG PO TABS: 500.0000 mg | ORAL_TABLET | Freq: Two times a day (BID) | ORAL | Status: DC

## 2012-10-17 NOTE — ED Provider Notes (Signed)
Medical screening examination/treatment/procedure(s) were performed by non-physician practitioner and as supervising physician I was immediately available for consultation/collaboration.   Rehema Muffley B. Rayden Dock, MD 10/17/12 2003 

## 2012-10-17 NOTE — ED Notes (Signed)
Per patient, she started having an odor and vaginal discharge. Hx of bacterial infections.

## 2012-10-17 NOTE — ED Provider Notes (Signed)
History     CSN: [id number]  Arrival date & time 10/17/12  1706   First MD Initiated Contact with Patient 10/17/12 1711      Chief Complaint  Patient presents with  . Vaginal Discharge    (Consider location/radiation/quality/duration/timing/severity/associated sxs/prior treatment) HPI Comments: Patient is a 27 year old female who presents with a 1 week history of vaginal discharge. Symptoms started gradually and progressively worsened since the onset. She reports associated odor with the discharge. The discharge is green and yellow. No aggravating/alleviating factors. No other symptoms.    Past Medical History  Diagnosis Date  . Bacterial vaginosis   . Urinary tract infection   . Chlamydia   . Infection     UTI X 1  . Infection 2009    GC  . Infection     YEAST OCC  . Infection     FREQUENT BV  . Infection 2001    CHLAMYDIA    Past Surgical History  Procedure Laterality Date  . Induced abortion      Family History  Problem Relation Age of Onset  . Anesthesia problems Neg Hx   . Alcohol abuse Mother   . Asthma Father   . Asthma Paternal Aunt   . Other Maternal Grandmother     STOMACH ULCER  . Mitral valve prolapse Maternal Grandmother   . Cancer Paternal Grandfather     PROSTATE  . Diabetes Paternal Grandfather     History  Substance Use Topics  . Smoking status: Former Smoker    Types: Cigarettes    Quit date: 01/13/2011  . Smokeless tobacco: Never Used     Comment: 2008  . Alcohol Use: 3.5 oz/week    7 drink(s) per week     Comment: occasional-not with preg; WINE DAILY ; D/'C'D 12/2011    OB History   Grav Para Term Preterm Abortions TAB SAB Ect Mult Living   3 2 2  0 1 0 1 0 0 2      Review of Systems  Genitourinary: Positive for vaginal discharge.  All other systems reviewed and are negative.    Allergies  Review of patient's allergies indicates no known allergies.  Home Medications   Current Outpatient Rx  Name  Route  Sig   Dispense  Refill  . ibuprofen (ADVIL,MOTRIN) 600 MG tablet   Oral   Take 1 tablet (600 mg total) by mouth every 6 (six) hours as needed for pain.   30 tablet   0   . oxyCODONE-acetaminophen (PERCOCET/ROXICET) 5-325 MG per tablet   Oral   Take 1-2 tablets by mouth every 6 (six) hours as needed (moderate - severe pain).   30 tablet   0     BP 126/83  Pulse 80  Temp(Src) 99.1 F (37.3 C) (Oral)  Resp 20  SpO2 100%  Physical Exam  Nursing note and vitals reviewed. Constitutional: She appears well-developed and well-nourished. No distress.  HENT:  Head: Normocephalic and atraumatic.  Eyes: Conjunctivae are normal.  Neck: Normal range of motion.  Cardiovascular: Normal rate and regular rhythm.  Exam reveals no gallop and no friction rub.   No murmur heard. Pulmonary/Chest: Effort normal and breath sounds normal. She has no wheezes. She has no rales. She exhibits no tenderness.  Abdominal: Soft. She exhibits no distension. There is no tenderness. There is no rebound and no guarding.  Genitourinary:  Green/yellow thick vaginal discharge. Cervical os closed. No CMT. No adnexal tenderness or abnormal masses palpated.  Musculoskeletal: Normal range of motion.  Neurological: She is alert.  Speech is goal-oriented. Moves limbs without ataxia.   Skin: Skin is warm and dry.  Psychiatric: She has a normal mood and affect. Her behavior is normal.    ED Course  Procedures (including critical care time)  Labs Reviewed  WET PREP, GENITAL - Abnormal; Notable for the following:    Clue Cells Wet Prep HPF POC FEW (*)    WBC, Wet Prep HPF POC MANY (*)    All other components within normal limits  GC/CHLAMYDIA PROBE AMP  URINALYSIS, ROUTINE W REFLEX MICROSCOPIC  PREGNANCY, URINE   No results found.   1. BV (bacterial vaginosis)       MDM  6:38 PM Urinalysis unremarkable. Urine preg negative. Patient will be treated prophylactically for GC/Chlamydia and will be contacted within  48 hours if results are positive. She will also be treated for BV. Patient afebrile with stable vitals. Patient instructed to return to the ED with worsening or concerning symptoms.         Emilia Beck, PA-C 10/17/12 1845

## 2012-10-17 NOTE — ED Notes (Signed)
White vaginal discharge with odor since 10/14/12.  Denies vaginal itching or dysuria.  Received Depo on 08/03/12 and reports some mild "spotting".

## 2012-10-18 LAB — GC/CHLAMYDIA PROBE AMP
CT Probe RNA: NEGATIVE
GC Probe RNA: NEGATIVE

## 2013-01-15 ENCOUNTER — Encounter (HOSPITAL_BASED_OUTPATIENT_CLINIC_OR_DEPARTMENT_OTHER): Payer: Self-pay | Admitting: Emergency Medicine

## 2013-01-15 ENCOUNTER — Emergency Department (HOSPITAL_BASED_OUTPATIENT_CLINIC_OR_DEPARTMENT_OTHER)
Admission: EM | Admit: 2013-01-15 | Discharge: 2013-01-15 | Discharge status: Against Medical Advice | Payer: Medicaid Other | Source: Home / Self Care | Attending: Emergency Medicine | Admitting: Emergency Medicine

## 2013-01-15 ENCOUNTER — Encounter (HOSPITAL_BASED_OUTPATIENT_CLINIC_OR_DEPARTMENT_OTHER): Payer: Self-pay | Admitting: *Deleted

## 2013-01-15 ENCOUNTER — Emergency Department (HOSPITAL_BASED_OUTPATIENT_CLINIC_OR_DEPARTMENT_OTHER)
Admission: EM | Admit: 2013-01-15 | Discharge: 2013-01-15 | Discharge status: Against Medical Advice | Payer: Medicaid Other | Attending: Emergency Medicine | Admitting: Emergency Medicine

## 2013-01-15 DIAGNOSIS — N898 Other specified noninflammatory disorders of vagina: Secondary | ICD-10-CM | POA: Insufficient documentation

## 2013-01-15 LAB — URINALYSIS, ROUTINE W REFLEX MICROSCOPIC
Bilirubin Urine: NEGATIVE
Glucose, UA: NEGATIVE mg/dL
Hgb urine dipstick: NEGATIVE
Ketones, ur: NEGATIVE mg/dL
Leukocytes, UA: NEGATIVE
Specific Gravity, Urine: 1.021 (ref 1.005–1.030)
Urobilinogen, UA: 0.2 mg/dL (ref 0.0–1.0)
pH: 6 (ref 5.0–8.0)

## 2013-01-15 NOTE — ED Notes (Signed)
Patient called x3 for room. No answer.  

## 2013-01-15 NOTE — ED Notes (Signed)
Called for exam room placement x 2 no answer

## 2013-01-15 NOTE — ED Notes (Signed)
Pt c/o vaginal discharge x 5 days

## 2013-01-15 NOTE — ED Notes (Signed)
Registration called and stated that pt had to leave because she had to go pick up her child.

## 2013-01-15 NOTE — ED Notes (Signed)
Pt states she has a hx of bacterial infections and has had vaginal discharge with a foul odor x 5 days

## 2013-01-16 ENCOUNTER — Encounter (HOSPITAL_COMMUNITY): Payer: Self-pay

## 2013-01-16 ENCOUNTER — Inpatient Hospital Stay (HOSPITAL_COMMUNITY)
Admission: AD | Admit: 2013-01-16 | Discharge: 2013-01-16 | Disposition: A | Discharge status: Home / Self Care | Payer: Medicaid Other | Source: Ambulatory Visit | Attending: Obstetrics & Gynecology | Admitting: Obstetrics & Gynecology

## 2013-01-16 DIAGNOSIS — N760 Acute vaginitis: Secondary | ICD-10-CM

## 2013-01-16 DIAGNOSIS — N949 Unspecified condition associated with female genital organs and menstrual cycle: Secondary | ICD-10-CM | POA: Insufficient documentation

## 2013-01-16 DIAGNOSIS — A499 Bacterial infection, unspecified: Secondary | ICD-10-CM

## 2013-01-16 DIAGNOSIS — B9689 Other specified bacterial agents as the cause of diseases classified elsewhere: Secondary | ICD-10-CM | POA: Insufficient documentation

## 2013-01-16 LAB — WET PREP, GENITAL: Yeast Wet Prep HPF POC: NONE SEEN

## 2013-01-16 MED ORDER — METRONIDAZOLE 500 MG PO TABS: 500.0000 mg | ORAL_TABLET | Freq: Two times a day (BID) | ORAL | Status: DC

## 2013-01-16 NOTE — MAU Note (Signed)
Pt reports vaginal discharge and odor, states she knows it is bacterial vaginosis because she has it so often

## 2013-01-16 NOTE — MAU Provider Note (Signed)
History     CSN: [id number]  Arrival date and time: 01/16/13 2120   First Provider Initiated Contact with Patient 01/16/13 2234      Chief Complaint  Patient presents with  . Vaginal Discharge   HPI [name] is a 27 y.o. [id number] who presents to MAU today with vaginal discharge x 5 days. The patient states a thin, white-yellow discharge noted with odor. She denies pelvic pain, N/V/D or constipation, fever or UTI symptoms.   OB History   Grav Para Term Preterm Abortions TAB SAB Ect Mult Living   3 2 2  0 1 0 1 0 0 2      Past Medical History  Diagnosis Date  . Bacterial vaginosis   . Urinary tract infection   . Chlamydia   . Infection     UTI X 1  . Infection 2009    GC  . Infection     YEAST OCC  . Infection     FREQUENT BV  . Infection 2001    CHLAMYDIA    Past Surgical History  Procedure Laterality Date  . Induced abortion      Family History  Problem Relation Age of Onset  . Anesthesia problems Neg Hx   . Alcohol abuse Mother   . Asthma Father   . Asthma Paternal Aunt   . Other Maternal Grandmother     STOMACH ULCER  . Mitral valve prolapse Maternal Grandmother   . Cancer Paternal Grandfather     PROSTATE  . Diabetes Paternal Grandfather     History  Substance Use Topics  . Smoking status: Former Smoker    Types: Cigarettes    Quit date: 01/13/2011  . Smokeless tobacco: Never Used     Comment: 2008  . Alcohol Use: 0.0 oz/week     Comment: wine qod    Allergies: No Known Allergies  No prescriptions prior to admission    Review of Systems  Constitutional: Negative for fever and malaise/fatigue.  Gastrointestinal: Negative for nausea, vomiting, abdominal pain, diarrhea and constipation.  Genitourinary: Negative for dysuria, urgency and frequency.       + vaginal discharge Neg - vaginal bleeding   Physical Exam   Blood pressure 124/78, pulse 94, temperature 98.3 F (36.8 C), temperature source Oral, resp. rate 18, height 5'  9" (1.753 m), weight 176 lb (79.833 kg), SpO2 100.00%.  Physical Exam  Constitutional: She is oriented to person, place, and time. She appears well-developed and well-nourished. No distress.  HENT:  Head: Normocephalic and atraumatic.  Cardiovascular: Normal rate.   Respiratory: Effort normal.  GI: Soft. She exhibits no distension. There is no tenderness. There is no rebound and no guarding.  Genitourinary: Uterus is not enlarged and not tender. Cervix exhibits no motion tenderness, no discharge and no friability. Right adnexum displays no mass and no tenderness. Left adnexum displays no mass and no tenderness. No bleeding around the vagina. Vaginal discharge (small amount of thin, white, malodorous discharge noted in the vagina) found.  Neurological: She is alert and oriented to person, place, and time.  Skin: Skin is warm and dry. No erythema.  Psychiatric: She has a normal mood and affect.   Results for orders placed during the hospital encounter of 01/16/13 (from the past 24 hour(s))  WET PREP, GENITAL     Status: Abnormal   Collection Time    01/16/13 10:40 PM      Result Value Range   Yeast Wet Prep HPF POC NONE  SEEN  NONE SEEN   Trich, Wet Prep NONE SEEN  NONE SEEN   Clue Cells Wet Prep HPF POC MODERATE (*) NONE SEEN   WBC, Wet Prep HPF POC MODERATE (*) NONE SEEN     MAU Course  Procedures None  MDM Wet prep, GC/Chlamydia today  Assessment and Plan  A: Bacterial vaginosis  P: Discharge home Rx for Flagyl sent to patient's pharmacy Discussed probiotics and hygiene products for avoiding recurrence of BV Patient advised to follow-up with PCP as needed or if symptoms persist or worsen Patient may return to MAU as needed  Freddi Starr, PA-C  01/16/2013, 11:48 PM

## 2013-06-03 ENCOUNTER — Encounter (HOSPITAL_BASED_OUTPATIENT_CLINIC_OR_DEPARTMENT_OTHER): Payer: Self-pay | Admitting: Emergency Medicine

## 2013-06-03 ENCOUNTER — Emergency Department (HOSPITAL_BASED_OUTPATIENT_CLINIC_OR_DEPARTMENT_OTHER)
Admission: EM | Admit: 2013-06-03 | Discharge: 2013-06-03 | Disposition: A | Discharge status: Home / Self Care | Payer: Medicaid Other | Attending: Emergency Medicine | Admitting: Emergency Medicine

## 2013-06-03 DIAGNOSIS — Z8742 Personal history of other diseases of the female genital tract: Secondary | ICD-10-CM | POA: Insufficient documentation

## 2013-06-03 DIAGNOSIS — H0019 Chalazion unspecified eye, unspecified eyelid: Secondary | ICD-10-CM | POA: Insufficient documentation

## 2013-06-03 DIAGNOSIS — Z87440 Personal history of urinary (tract) infections: Secondary | ICD-10-CM | POA: Insufficient documentation

## 2013-06-03 DIAGNOSIS — Z8619 Personal history of other infectious and parasitic diseases: Secondary | ICD-10-CM | POA: Insufficient documentation

## 2013-06-03 DIAGNOSIS — Z87891 Personal history of nicotine dependence: Secondary | ICD-10-CM | POA: Insufficient documentation

## 2013-06-03 DIAGNOSIS — H0012 Chalazion right lower eyelid: Secondary | ICD-10-CM

## 2013-06-03 DIAGNOSIS — Z79899 Other long term (current) drug therapy: Secondary | ICD-10-CM | POA: Insufficient documentation

## 2013-06-03 MED ORDER — FLUORESCEIN SODIUM 1 MG OP STRP
ORAL_STRIP | OPHTHALMIC | Status: AC
  Filled 2013-06-03: qty 2

## 2013-06-03 MED ORDER — TETRACAINE HCL 0.5 % OP SOLN
OPHTHALMIC | Status: AC
  Filled 2013-06-03: qty 2

## 2013-06-03 MED ORDER — FLUORESCEIN SODIUM 1 MG OP STRP
2.0000 | ORAL_STRIP | Freq: Once | OPHTHALMIC | Status: AC
  Administered 2013-06-03: 11:00:00 via OPHTHALMIC
  Filled 2013-06-03: qty 2

## 2013-06-03 MED ORDER — TETRACAINE HCL 0.5 % OP SOLN
1.0000 [drp] | Freq: Once | OPHTHALMIC | Status: AC
  Administered 2013-06-03: 11:00:00 via OPHTHALMIC
  Filled 2013-06-03: qty 2

## 2013-06-03 NOTE — ED Notes (Signed)
Pt having right eye irritation, drainage, and stye to lower lid.  No known fever.

## 2013-06-03 NOTE — Discharge Instructions (Signed)
Chalazion  A chalazion is a swelling or hard lump on the eyelid caused by a blocked oil gland. Chalazions may occur on the upper or the lower eyelid.   CAUSES   Oil gland in the eyelid becomes blocked.  SYMPTOMS   · Swelling or hard lump on the eyelid. This lump may make it hard to see out of the eye.  · The swelling may spread to areas around the eye.  TREATMENT   · Although some chalazions disappear by themselves in 1 or 2 months, some chalazions may need to be removed.  · Medicines to treat an infection may be required.  HOME CARE INSTRUCTIONS   · Wash your hands often and dry them with a clean towel. Do not touch the chalazion.  · Apply heat to the eyelid several times a day for 10 minutes to help ease discomfort and bring any yellowish white fluid (pus) to the surface. One way to apply heat to a chalazion is to use the handle of a metal spoon.  · Hold the handle under hot water until it is hot, and then wrap the handle in paper towels so that the heat can come through without burning your skin.  · Hold the wrapped handle against the chalazion and reheat the spoon handle as needed.  · Apply heat in this fashion for 10 minutes, 4 times per day.  · Return to your caregiver to have the pus removed if it does not break (rupture) on its own.  · Do not try to remove the pus yourself by squeezing the chalazion or sticking it with a pin or needle.  · Only take over-the-counter or prescription medicines for pain, discomfort, or fever as directed by your caregiver.  SEEK IMMEDIATE MEDICAL CARE IF:   · You have pain in your eye.  · Your vision changes.  · The chalazion does not go away.  · The chalazion becomes painful, red, or swollen, grows larger, or does not start to disappear after 2 weeks.  MAKE SURE YOU:   · Understand these instructions.  · Will watch your condition.  · Will get help right away if you are not doing well or get worse.  Document Released: 06/19/2000 Document Revised: 09/14/2011 Document Reviewed:  10/07/2009  Exit[employer]® Patient Information ©2014 [employer].

## 2013-06-03 NOTE — ED Provider Notes (Signed)
CSN: [id number]     Arrival date & time 06/03/13  2440 History   First MD Initiated Contact with Patient 06/03/13 1006     Chief Complaint  Patient presents with  . Eye Problem   (Consider location/radiation/quality/duration/timing/severity/associated sxs/prior Treatment) Patient is a 27 y.o. female presenting with eye problem.  Eye Problem Associated symptoms: discharge and itching   Associated symptoms: no photophobia and no redness    Tiffany Wilkinson is a 27 y.o. woman who comes to the ED with a cc of eye pain. She has had progressive swelling of a spot on her right lower eyelid. Over the last few days she has increased foreign body sensation in that eye. She denies blurry vision diplopia, fever, nausea, vomiting. No ocular history.   Past Medical History  Diagnosis Date  . Bacterial vaginosis   . Urinary tract infection   . Chlamydia   . Infection     UTI X 1  . Infection 2009    GC  . Infection     YEAST OCC  . Infection     FREQUENT BV  . Infection 2001    CHLAMYDIA   Past Surgical History  Procedure Laterality Date  . Induced abortion     Family History  Problem Relation Age of Onset  . Anesthesia problems Neg Hx   . Alcohol abuse Mother   . Asthma Father   . Asthma Paternal Aunt   . Other Maternal Grandmother     STOMACH ULCER  . Mitral valve prolapse Maternal Grandmother   . Cancer Paternal Grandfather     PROSTATE  . Diabetes Paternal Grandfather    History  Substance Use Topics  . Smoking status: Former Smoker    Types: Cigarettes    Quit date: 01/13/2011  . Smokeless tobacco: Never Used     Comment: 2008  . Alcohol Use: 0.0 oz/week     Comment: wine qod   OB History   Grav Para Term Preterm Abortions TAB SAB Ect Mult Living   3 2 2  0 1 0 1 0 0 2     Review of Systems  Constitutional: Negative for fever, chills, activity change, appetite change and fatigue.  Eyes: Positive for pain, discharge and itching. Negative for photophobia, redness  and visual disturbance.  Respiratory: Negative for chest tightness and shortness of breath.   Cardiovascular: Negative for chest pain.  Gastrointestinal: Negative for abdominal pain, diarrhea, constipation and blood in stool.  Genitourinary: Negative for dysuria and difficulty urinating.    Allergies  Review of patient's allergies indicates no known allergies.  Home Medications   Current Outpatient Rx  Name  Route  Sig  Dispense  Refill  . norgestimate-ethinyl estradiol (ORTHO-CYCLEN,SPRINTEC,PREVIFEM) 0.25-35 MG-MCG tablet   Oral   Take 1 tablet by mouth daily.          BP 134/86  Pulse 69  Temp(Src) 98.9 F (37.2 C) (Oral)  Resp 16  Ht 5\' 8"  (1.727 m)  Wt 176 lb (79.833 kg)  BMI 26.77 kg/m2  SpO2 100%  LMP 05/19/2013 Physical Exam  Constitutional: She appears well-developed and well-nourished.  HENT:  Head: Normocephalic.  Mouth/Throat: Oropharynx is clear and moist. No oropharyngeal exudate.  Eyes: Right eye exhibits no chemosis, no discharge, no exudate and no hordeolum. No foreign body present in the right eye. Left eye exhibits no discharge, no exudate and no hordeolum. No foreign body present in the left eye. Right conjunctiva is not injected. Right conjunctiva has no hemorrhage.  Left conjunctiva is not injected. Left conjunctiva has no hemorrhage. No scleral icterus. Right eye exhibits normal extraocular motion and no nystagmus. Left eye exhibits normal extraocular motion and no nystagmus. Right pupil is round and reactive. Left pupil is round and reactive.  Slit lamp exam:      The right eye shows no corneal abrasion, no corneal flare, no corneal ulcer, no foreign body, no hyphema, no hypopyon and no fluorescein uptake.       The left eye shows no corneal abrasion, no corneal flare, no corneal ulcer, no foreign body, no hyphema, no hypopyon and no fluorescein uptake.    There is stye in the right lower lid. The swelling of the lid has caused an eyelash to contact  the nasal conjunctiva.    ED Course  Procedures (including critical care time) Labs Review Labs Reviewed - No data to display Imaging Review No results found.  EKG Interpretation   None       MDM   1. Chalazion of right lower eyelid     1. Stye c/b trichiasis  The patients symptoms are likely due to inverted eyelash due to chalazion. The eyelash was removed with forceps and the patient felt much better.  I recommended using warm compresses for the chalazion. The patient agreed with this plan. I instructed the patient to return to ED for new or worsening symptoms.   Pleas Koch, MD 06/03/13 1436

## 2013-06-04 NOTE — ED Provider Notes (Signed)
I saw and evaluated the patient, reviewed the resident's note and I agree with the findings and plan.   .Face to face Exam:  General:  Awake HEENT:  Atraumatic Resp:  Normal effort Abd:  Nondistended Neuro:No focal weakness  Hessel Penton L Juni Glaab, MD 06/04/13 0724 

## 2013-06-25 ENCOUNTER — Emergency Department (HOSPITAL_BASED_OUTPATIENT_CLINIC_OR_DEPARTMENT_OTHER)
Admission: EM | Admit: 2013-06-25 | Discharge: 2013-06-25 | Disposition: A | Discharge status: Home / Self Care | Payer: Medicaid Other | Attending: Emergency Medicine | Admitting: Emergency Medicine

## 2013-06-25 ENCOUNTER — Encounter (HOSPITAL_BASED_OUTPATIENT_CLINIC_OR_DEPARTMENT_OTHER): Payer: Self-pay | Admitting: Emergency Medicine

## 2013-06-25 DIAGNOSIS — Z8619 Personal history of other infectious and parasitic diseases: Secondary | ICD-10-CM | POA: Insufficient documentation

## 2013-06-25 DIAGNOSIS — Z87891 Personal history of nicotine dependence: Secondary | ICD-10-CM | POA: Insufficient documentation

## 2013-06-25 DIAGNOSIS — Z3202 Encounter for pregnancy test, result negative: Secondary | ICD-10-CM | POA: Insufficient documentation

## 2013-06-25 DIAGNOSIS — Z79899 Other long term (current) drug therapy: Secondary | ICD-10-CM | POA: Insufficient documentation

## 2013-06-25 DIAGNOSIS — Z87440 Personal history of urinary (tract) infections: Secondary | ICD-10-CM | POA: Insufficient documentation

## 2013-06-25 DIAGNOSIS — B9689 Other specified bacterial agents as the cause of diseases classified elsewhere: Secondary | ICD-10-CM | POA: Insufficient documentation

## 2013-06-25 DIAGNOSIS — N760 Acute vaginitis: Secondary | ICD-10-CM | POA: Insufficient documentation

## 2013-06-25 DIAGNOSIS — A499 Bacterial infection, unspecified: Secondary | ICD-10-CM | POA: Insufficient documentation

## 2013-06-25 LAB — URINALYSIS, ROUTINE W REFLEX MICROSCOPIC
Ketones, ur: NEGATIVE mg/dL
Leukocytes, UA: NEGATIVE
Nitrite: NEGATIVE
Protein, ur: NEGATIVE mg/dL
Urobilinogen, UA: 1 mg/dL (ref 0.0–1.0)

## 2013-06-25 LAB — WET PREP, GENITAL

## 2013-06-25 MED ORDER — AZITHROMYCIN 250 MG PO TABS: 1000.0000 mg | ORAL_TABLET | Freq: Once | ORAL | Status: DC

## 2013-06-25 MED ORDER — CEFTRIAXONE SODIUM 250 MG IJ SOLR: 250.0000 mg | Freq: Once | INTRAMUSCULAR | Status: DC

## 2013-06-25 MED ORDER — METRONIDAZOLE 500 MG PO TABS: 500.0000 mg | ORAL_TABLET | Freq: Two times a day (BID) | ORAL | Status: DC

## 2013-06-25 NOTE — ED Notes (Signed)
Dx with BV approx 2 weeks ago . Finished flagyl on Thursday. C/o discharge and odor

## 2013-06-25 NOTE — ED Provider Notes (Signed)
Medical screening examination/treatment/procedure(s) were performed by non-physician practitioner and as supervising physician I was immediately available for consultation/collaboration.  EKG Interpretation   None         Kristen N Ward, DO 06/25/13 2003 

## 2013-06-25 NOTE — ED Provider Notes (Signed)
CSN: [id number]     Arrival date & time 06/25/13  1532 History   First MD Initiated Contact with Patient 06/25/13 1547     Chief Complaint  Patient presents with  . Vaginal Discharge   (Consider location/radiation/quality/duration/timing/severity/associated sxs/prior Treatment) Patient is a 27 y.o. female presenting with vaginal discharge. The history is provided by the patient. No language interpreter was used.  Vaginal Discharge Quality:  White Severity:  Moderate Onset quality:  Gradual Duration:  3 days Timing:  Constant Progression:  Worsening Chronicity:  Chronic Context: after intercourse and at rest   Context: not during bowel movement, not during intercourse and not spontaneously   Relieved by:  Nothing Worsened by:  Nothing tried Ineffective treatments:  None tried Associated symptoms: no abdominal pain, no dysuria, no fever, no nausea and no vomiting   Risk factors: no endometriosis, no foreign body and no new sexual partner     Past Medical History  Diagnosis Date  . Bacterial vaginosis   . Urinary tract infection   . Chlamydia   . Infection     UTI X 1  . Infection 2009    GC  . Infection     YEAST OCC  . Infection     FREQUENT BV  . Infection 2001    CHLAMYDIA   Past Surgical History  Procedure Laterality Date  . Induced abortion     Family History  Problem Relation Age of Onset  . Anesthesia problems Neg Hx   . Alcohol abuse Mother   . Asthma Father   . Asthma Paternal Aunt   . Other Maternal Grandmother     STOMACH ULCER  . Mitral valve prolapse Maternal Grandmother   . Cancer Paternal Grandfather     PROSTATE  . Diabetes Paternal Grandfather    History  Substance Use Topics  . Smoking status: Former Smoker    Types: Cigarettes    Quit date: 01/13/2011  . Smokeless tobacco: Never Used     Comment: 2008  . Alcohol Use: 6.0 oz/week    10 Glasses of wine per week     Comment: wine qod   OB History   Grav Para Term Preterm Abortions  TAB SAB Ect Mult Living   3 2 2  0 1 0 1 0 0 2     Review of Systems  Constitutional: Negative for fever, chills and fatigue.  HENT: Negative for trouble swallowing.   Eyes: Negative for visual disturbance.  Respiratory: Negative for shortness of breath.   Cardiovascular: Negative for chest pain and palpitations.  Gastrointestinal: Negative for nausea, vomiting, abdominal pain and diarrhea.  Genitourinary: Positive for vaginal discharge. Negative for dysuria and difficulty urinating.  Musculoskeletal: Negative for arthralgias and neck pain.  Skin: Negative for color change.  Neurological: Negative for dizziness and weakness.  Psychiatric/Behavioral: Negative for dysphoric mood.    Allergies  Review of patient's allergies indicates no known allergies.  Home Medications   Current Outpatient Rx  Name  Route  Sig  Dispense  Refill  . norgestimate-ethinyl estradiol (ORTHO-CYCLEN,SPRINTEC,PREVIFEM) 0.25-35 MG-MCG tablet   Oral   Take 1 tablet by mouth daily.          BP 127/78  Pulse 79  Temp(Src) 98.7 F (37.1 C) (Oral)  Resp 18  Ht 5\' 9"  (1.753 m)  Wt 177 lb (80.287 kg)  BMI 26.13 kg/m2  SpO2 100%  LMP 05/12/2013  Breastfeeding? No Physical Exam  Nursing note and vitals reviewed. Constitutional: She is oriented to  person, place, and time. She appears well-developed and well-nourished. No distress.  HENT:  Head: Normocephalic and atraumatic.  Eyes: Conjunctivae and EOM are normal.  Neck: Normal range of motion.  Cardiovascular: Normal rate and regular rhythm.  Exam reveals no gallop and no friction rub.   No murmur heard. Pulmonary/Chest: Effort normal and breath sounds normal. She has no wheezes. She has no rales. She exhibits no tenderness.  Abdominal: Soft. She exhibits no distension. There is no tenderness. There is no rebound.  Genitourinary:  Copious white vaginal discharge. No CMT. Cervical os closed.   Musculoskeletal: Normal range of motion.  Neurological:  She is alert and oriented to person, place, and time. Coordination normal.  Speech is goal-oriented. Moves limbs without ataxia.   Skin: Skin is warm and dry.  Psychiatric: She has a normal mood and affect. Her behavior is normal.    ED Course  Procedures (including critical care time) Labs Review Labs Reviewed  WET PREP, GENITAL - Abnormal; Notable for the following:    Clue Cells Wet Prep HPF POC MODERATE (*)    WBC, Wet Prep HPF POC TOO NUMEROUS TO COUNT (*)    All other components within normal limits  GC/CHLAMYDIA PROBE AMP  URINALYSIS, ROUTINE W REFLEX MICROSCOPIC  PREGNANCY, URINE   Imaging Review No results found.  EKG Interpretation   None       MDM   1. BV (bacterial vaginosis)     5:32 PM Wet prep shows TNTC WBC and moderate clue cells. Patient will be treated for BV. Patient refuses GC/Chlamydia treatment here. Patient wil be contacted within 48 hours if gc/chlamydia results are positive. Urinalysis unremarkable for pregnancy or infection. Vitals stable and patient afebrile.     Emilia Beck, PA-C 06/25/13 [address] PA-C 06/25/13 1743

## 2014-04-11 ENCOUNTER — Other Ambulatory Visit: Payer: Self-pay | Admitting: Family Medicine

## 2014-04-15 ENCOUNTER — Other Ambulatory Visit: Payer: Self-pay | Admitting: Family Medicine

## 2014-04-26 ENCOUNTER — Other Ambulatory Visit: Payer: Self-pay | Admitting: Family Medicine

## 2014-04-28 ENCOUNTER — Other Ambulatory Visit: Payer: Self-pay | Admitting: Family Medicine

## 2014-05-07 ENCOUNTER — Encounter (HOSPITAL_BASED_OUTPATIENT_CLINIC_OR_DEPARTMENT_OTHER): Payer: Self-pay | Admitting: Emergency Medicine

## 2014-11-05 ENCOUNTER — Encounter (HOSPITAL_BASED_OUTPATIENT_CLINIC_OR_DEPARTMENT_OTHER): Payer: Self-pay

## 2014-11-05 ENCOUNTER — Emergency Department (HOSPITAL_BASED_OUTPATIENT_CLINIC_OR_DEPARTMENT_OTHER)
Admission: EM | Admit: 2014-11-05 | Discharge: 2014-11-05 | Discharge status: Against Medical Advice | Payer: Medicaid Other | Attending: Emergency Medicine | Admitting: Emergency Medicine

## 2014-11-05 DIAGNOSIS — Z87891 Personal history of nicotine dependence: Secondary | ICD-10-CM | POA: Diagnosis not present

## 2014-11-05 DIAGNOSIS — Z3200 Encounter for pregnancy test, result unknown: Secondary | ICD-10-CM | POA: Diagnosis present

## 2014-11-05 DIAGNOSIS — Z3201 Encounter for pregnancy test, result positive: Secondary | ICD-10-CM | POA: Diagnosis not present

## 2014-11-05 LAB — PREGNANCY, URINE: PREG TEST UR: POSITIVE — AB

## 2014-11-05 NOTE — ED Notes (Signed)
Pt not in ED WR-was seen by reg clerk walking out of ED talking on phone

## 2014-11-05 NOTE — ED Notes (Signed)
Pt requesting preg test-? Positive home preg test-denies pain

## 2014-11-05 NOTE — ED Notes (Signed)
Not in ED WR when called for tx area 

## 2014-12-10 LAB — OB RESULTS CONSOLE HGB/HCT, BLOOD
HEMATOCRIT: 39 %
Hemoglobin: 13 g/dL

## 2014-12-10 LAB — OB RESULTS CONSOLE GC/CHLAMYDIA
Chlamydia: NEGATIVE
Gonorrhea: NEGATIVE

## 2014-12-10 LAB — OB RESULTS CONSOLE PLATELET COUNT: Platelets: 333 10*3/uL

## 2014-12-10 LAB — OB RESULTS CONSOLE HEPATITIS B SURFACE ANTIGEN: Hepatitis B Surface Ag: NEGATIVE

## 2014-12-10 LAB — OB RESULTS CONSOLE HIV ANTIBODY (ROUTINE TESTING): HIV: NONREACTIVE

## 2014-12-10 LAB — OB RESULTS CONSOLE RUBELLA ANTIBODY, IGM: RUBELLA: IMMUNE

## 2015-01-29 LAB — US OB COMP + 14 WK

## 2015-02-08 ENCOUNTER — Ambulatory Visit (HOSPITAL_COMMUNITY)
Admission: RE | Admit: 2015-02-08 | Discharge: 2015-02-08 | Disposition: A | Discharge status: Home / Self Care | Payer: Medicaid Other | Source: Ambulatory Visit | Attending: Obstetrics and Gynecology | Admitting: Obstetrics and Gynecology

## 2015-02-08 ENCOUNTER — Encounter (HOSPITAL_COMMUNITY): Payer: Self-pay

## 2015-02-08 VITALS — BP 114/66 | HR 83 | Wt 165.5 lb

## 2015-02-08 DIAGNOSIS — Z87891 Personal history of nicotine dependence: Secondary | ICD-10-CM | POA: Diagnosis not present

## 2015-02-08 DIAGNOSIS — Z3A17 17 weeks gestation of pregnancy: Secondary | ICD-10-CM

## 2015-02-08 DIAGNOSIS — M79673 Pain in unspecified foot: Secondary | ICD-10-CM

## 2015-02-08 DIAGNOSIS — O99322 Drug use complicating pregnancy, second trimester: Secondary | ICD-10-CM | POA: Insufficient documentation

## 2015-02-08 DIAGNOSIS — G8929 Other chronic pain: Secondary | ICD-10-CM

## 2015-02-08 DIAGNOSIS — F1190 Opioid use, unspecified, uncomplicated: Secondary | ICD-10-CM | POA: Diagnosis not present

## 2015-02-08 DIAGNOSIS — F1120 Opioid dependence, uncomplicated: Secondary | ICD-10-CM

## 2015-02-08 NOTE — Progress Notes (Signed)
MFM Consult  29 year old, G4 P2012, currently at 17 weeks 5 days gestation with EDD of 07/14/2015 seen for evaluation of chronic oxycodone use. Her pregnancy itself has bee uncomplicated to date.   She had an injury cheerleading of her foot. She had been evaluated multiple times and it appears a bone is out of place per her report and she has had pain on and off since that time. She has not re injured it recently, but has had increasing difficulties with pain. She has tried NSAIDs in the past without significant improvement. She has never really had PT. Current dose of Oxycodone is 7.5mg  TID.    Past Medical History  Diagnosis Date  . Bacterial vaginosis   . Urinary tract infection   . Chlamydia   . Infection     UTI X 1  . Infection 2009    GC  . Infection     YEAST OCC  . Infection     FREQUENT BV  . Infection 2001    CHLAMYDIA   Past Surgical History  Procedure Laterality Date  . Induced abortion     History   Social History  . Marital Status: Single    Spouse Name: N/A  . Number of Children: 1  . Years of Education: 16   Occupational History  . [employer]    Social History Main Topics  . Smoking status: Former Smoker    Types: Cigarettes    Quit date: 01/13/2011  . Smokeless tobacco: Never Used     Comment: 2008  . Alcohol Use: Yes     Comment: none with pregnancy  . Drug Use: No  . Sexual Activity:    Partners: Male    Birth Control/ Protection: None   Other Topics Concern  . Not on file   Social History Narrative   Family History  Problem Relation Age of Onset  . Anesthesia problems Neg Hx   . Alcohol abuse Mother   . Asthma Father   . Asthma Paternal Aunt   . Other Maternal Grandmother     STOMACH ULCER  . Mitral valve prolapse Maternal Grandmother   . Cancer Paternal Grandfather     PROSTATE  . Diabetes Paternal Grandfather    No Known Allergies  BP 114/66 P 84 Weight 165.8 lbs  #1 Chronic foot pain - We discussed that she can take  tylenol as noted on the bottle, but not more. She cannot take other NSAIDs during pregnancy.  - She is not currently under the care or has not been recently evaluated by a podiatrist or foot specialist. Would get a consultation with them. She can have x-rays of her foot/ankle with abdominal shielding.  - After this is complete, maybe physical therapy could help lessen or stop her need for chronic narcotic use by use of orthotics or stretching or strengthening exercise/manipulations. - We discuss oxycodone use in pregnancy as related to the developing baby, fetal growth through pregnancy and possible neonatal withdrawal post delivery (make her pediatrician aware after delivery to watch for signs of withdrawal if continues through pregnancy - We discussed stopping narcotic use during pregnancy was ideal, but if needed to be continued,  discussed using the lowest dose in pregnancy to allow her to function; this is not a pain free goal as she is pregnant.  Questions appear answered to her satisfaction. Precautions for the above given. Spent greater than 1/2 of 35 minute visit face to face counseling

## 2015-02-11 ENCOUNTER — Encounter (HOSPITAL_COMMUNITY): Payer: Self-pay | Admitting: Obstetrics and Gynecology

## 2015-02-11 ENCOUNTER — Other Ambulatory Visit (HOSPITAL_COMMUNITY): Payer: Self-pay | Admitting: Obstetrics and Gynecology

## 2015-04-24 ENCOUNTER — Encounter: Payer: Medicaid Other | Admitting: Advanced Practice Midwife

## 2015-04-27 ENCOUNTER — Encounter: Payer: Self-pay | Admitting: *Deleted

## 2015-04-27 DIAGNOSIS — M79673 Pain in unspecified foot: Secondary | ICD-10-CM

## 2015-04-27 DIAGNOSIS — G8929 Other chronic pain: Secondary | ICD-10-CM | POA: Insufficient documentation

## 2015-04-27 DIAGNOSIS — M79672 Pain in left foot: Secondary | ICD-10-CM

## 2015-04-27 DIAGNOSIS — O0990 Supervision of high risk pregnancy, unspecified, unspecified trimester: Secondary | ICD-10-CM | POA: Insufficient documentation

## 2015-05-06 ENCOUNTER — Ambulatory Visit (INDEPENDENT_AMBULATORY_CARE_PROVIDER_SITE_OTHER): Payer: Medicaid Other | Admitting: Obstetrics & Gynecology

## 2015-05-06 VITALS — BP 107/65 | HR 82 | Temp 97.9°F | Wt 177.7 lb

## 2015-05-06 DIAGNOSIS — O0990 Supervision of high risk pregnancy, unspecified, unspecified trimester: Secondary | ICD-10-CM

## 2015-05-06 DIAGNOSIS — M79672 Pain in left foot: Secondary | ICD-10-CM

## 2015-05-06 DIAGNOSIS — O0993 Supervision of high risk pregnancy, unspecified, third trimester: Secondary | ICD-10-CM

## 2015-05-06 DIAGNOSIS — O99323 Drug use complicating pregnancy, third trimester: Secondary | ICD-10-CM

## 2015-05-06 DIAGNOSIS — G8929 Other chronic pain: Secondary | ICD-10-CM

## 2015-05-06 DIAGNOSIS — F1120 Opioid dependence, uncomplicated: Secondary | ICD-10-CM | POA: Insufficient documentation

## 2015-05-06 LAB — POCT URINALYSIS DIP (DEVICE)
Bilirubin Urine: NEGATIVE
GLUCOSE, UA: NEGATIVE mg/dL
HGB URINE DIPSTICK: NEGATIVE
Ketones, ur: NEGATIVE mg/dL
NITRITE: NEGATIVE
PH: 7 (ref 5.0–8.0)
PROTEIN: NEGATIVE mg/dL
SPECIFIC GRAVITY, URINE: 1.02 (ref 1.005–1.030)
Urobilinogen, UA: 0.2 mg/dL (ref 0.0–1.0)

## 2015-05-06 MED ORDER — OXYCODONE HCL 7.5 MG PO TABS: 1.0000 | ORAL_TABLET | Freq: Three times a day (TID) | ORAL | Status: DC

## 2015-05-06 MED ORDER — OXYCODONE-ACETAMINOPHEN 7.5-325 MG PO TABS: 1.0000 | ORAL_TABLET | Freq: Three times a day (TID) | ORAL | Status: DC | PRN

## 2015-05-06 NOTE — Patient Instructions (Signed)

## 2015-05-06 NOTE — Progress Notes (Signed)
   Subjective:transfer [facility] due to chronic narcotics for pain    Tiffany Wilkinson is a [id number] 7992w1d being seen today for her first obstetrical visit.  Her obstetrical history is significant for oxycodone use for pain from foot injury. Patient does intend to breast feed. Pregnancy history fully reviewed.  Patient reports no complaints and foot pain.  Filed Vitals:   05/06/15 0910  BP: 107/65  Pulse: 82  Temp: 97.9 F (36.6 C)  Weight: 177 lb 11.2 oz (80.604 kg)    HISTORY: OB History  Gravida Para Term Preterm AB SAB TAB Ectopic Multiple Living  4 2 2  0 1 1 0 0 0 2    # Outcome Date GA Lbr Len/2nd Weight Sex Delivery Anes PTL Lv  4 Current           3 Term 08/01/12 2065w3d 07:00 / 00:15 6 lb 12.3 oz (3.07 kg) F Vag-Spont EPI  Y  2 Term 09/18/07 153w0d 15:00 7 lb (3.175 kg) F Vag-Spont EPI N Y  1 SAB 2001 2829w0d            Past Medical History  Diagnosis Date  . Bacterial vaginosis   . Urinary tract infection   . Chlamydia   . Infection     UTI X 1  . Infection 2009    GC  . Infection     YEAST OCC  . Infection     FREQUENT BV  . Infection 2001    CHLAMYDIA   Past Surgical History  Procedure Laterality Date  . Induced abortion     Family History  Problem Relation Age of Onset  . Anesthesia problems Neg Hx   . Alcohol abuse Mother   . Asthma Father   . Asthma Paternal Aunt   . Other Maternal Grandmother     STOMACH ULCER  . Mitral valve prolapse Maternal Grandmother   . Cancer Paternal Grandfather     PROSTATE  . Diabetes Paternal Grandfather      Exam    Uterus:     Pelvic Exam:                                    Skin: normal coloration and turgor, no rashes    Neurologic: oriented, normal mood   Extremities: normal strength, tone, and muscle mass   HEENT extra ocular movement intact   Mouth/Teeth dental hygiene good   Neck supple   Cardiovascular: regular rate and rhythm   Respiratory:  appears well, vitals normal, no respiratory distress,  acyanotic, normal RR   Abdomen: gravid   Urinary:       Assessment:    Pregnancy: A5W0981G4P2012 Patient Active Problem List   Diagnosis Date Noted  . Supervision of high risk pregnancy, antepartum 04/27/2015  . Chronic foot pain 04/27/2015  oxycodone use transfer of care from [facility]      Plan:     Initial labs drawn. Prenatal vitamins. Problem list reviewed and updated. Genetic Screening discussed done at [facility].  Follow up in 2 weeks. 50% of 30 min visit spent on counseling and coordination of care.  Needs 1 hr GTT Rx by her PCP   Tiffany Wilkinson 05/06/2015

## 2015-05-22 ENCOUNTER — Encounter: Payer: Medicaid Other | Admitting: Family Medicine

## 2015-05-23 ENCOUNTER — Encounter: Payer: Self-pay | Admitting: Physician Assistant

## 2015-05-23 ENCOUNTER — Ambulatory Visit (INDEPENDENT_AMBULATORY_CARE_PROVIDER_SITE_OTHER): Payer: Medicaid Other | Admitting: Physician Assistant

## 2015-05-23 VITALS — BP 110/63 | HR 93 | Temp 98.7°F | Wt 178.9 lb

## 2015-05-23 DIAGNOSIS — O0990 Supervision of high risk pregnancy, unspecified, unspecified trimester: Secondary | ICD-10-CM

## 2015-05-23 DIAGNOSIS — O0993 Supervision of high risk pregnancy, unspecified, third trimester: Secondary | ICD-10-CM | POA: Diagnosis not present

## 2015-05-23 DIAGNOSIS — Z23 Encounter for immunization: Secondary | ICD-10-CM | POA: Diagnosis not present

## 2015-05-23 LAB — CBC
HCT: 33.1 % — ABNORMAL LOW (ref 36.0–46.0)
HEMOGLOBIN: 11 g/dL — AB (ref 12.0–15.0)
MCH: 29.8 pg (ref 26.0–34.0)
MCHC: 33.2 g/dL (ref 30.0–36.0)
MCV: 89.7 fL (ref 78.0–100.0)
MPV: 8.2 fL — ABNORMAL LOW (ref 8.6–12.4)
Platelets: 230 10*3/uL (ref 150–400)
RBC: 3.69 MIL/uL — ABNORMAL LOW (ref 3.87–5.11)
RDW: 12.7 % (ref 11.5–15.5)
WBC: 13.4 10*3/uL — AB (ref 4.0–10.5)

## 2015-05-23 LAB — POCT URINALYSIS DIP (DEVICE)
Bilirubin Urine: NEGATIVE
Glucose, UA: NEGATIVE mg/dL
Hgb urine dipstick: NEGATIVE
Ketones, ur: 15 mg/dL — AB
Leukocytes, UA: NEGATIVE
Nitrite: NEGATIVE
PH: 7 (ref 5.0–8.0)
PROTEIN: NEGATIVE mg/dL
Specific Gravity, Urine: 1.02 (ref 1.005–1.030)
UROBILINOGEN UA: 1 mg/dL (ref 0.0–1.0)

## 2015-05-23 MED ORDER — TETANUS-DIPHTH-ACELL PERTUSSIS 5-2.5-18.5 LF-MCG/0.5 IM SUSP
0.5000 mL | Freq: Once | INTRAMUSCULAR | Status: AC
  Administered 2015-05-23: 0.5 mL via INTRAMUSCULAR

## 2015-05-23 NOTE — Patient Instructions (Signed)
Pain Relief During Labor and Delivery Everyone experiences pain differently, but labor causes severe pain for many women. The amount of pain you experience during labor and delivery depends on your pain tolerance, contraction strength, and your baby's size and position. There are many ways to prepare for and deal with the pain, including:   Taking prenatal classes to learn about labor and delivery. The more informed you are, the less anxious and afraid you may be. This can help lessen the pain.  Taking pain-relieving medicine during labor and delivery.  Learning breathing and relaxation techniques.  Taking a shower or bath.  Getting massaged.  Changing positions.  Placing an ice pack on your back. Discuss your pain control options with your health care provider during your prenatal visits.  WHAT ARE THE TWO TYPES OF PAIN-RELIEVING MEDICINES? 1. Analgesics. These are medicines that decrease pain without total loss of feeling or muscle movement. 2. Anesthetics. These are medicines that block all feeling, including pain. There can be minor side effects of both types, such as nausea, trouble concentrating, becoming sleepy, and lowering the heart rate of the baby. However, health care providers are careful to give doses that will not seriously affect the baby.  WHAT ARE THE SPECIFIC TYPES OF ANALGESICS AND ANESTHETICS? Systemic Analgesic Systemic pain medicines affect your whole body rather than focusing pain relief on the area of your body experiencing pain. This type of medicine is given either through an IV tube in your vein or by a shot (injection) into your muscle. This medicine will lessen your pain but will not stop it completely. It may also make you sleepy, but it will not make you lose consciousness.  Local Anesthetic Local anesthetic isused tonumb a small area of your body. The medicine is injected into the area of nerves that carry feeling to the vagina, vulva, or the area between  the vagina and anus (perineum).  General Anesthetic This type of medicine causes you to lose consciousness so you do not feel pain. It is usually used only in emergency situations during labor. It is given through an IV tube or face mask. Paracervical Block A paracervical block is a form of local anesthesia given during labor. Numbing medicine is injected into the right and left sides of the cervix and vagina. It helps to lessen the pain caused by contractions and stretching of the cervix. It may have to be given more than once.  Pudendal Block A pudendal block is another form of local anesthesia. It is used to relieve the pain associated with pushing or stretching of the perineum at the time of delivery. An injection is given deep through the vaginal wall into the pudendal nerve in the pelvis, numbing the perineum.  Epidural Anesthetic An epidural is an injection of numbing medicine given in the lower back and into the epidural space near your spinal cord. The epidural numbs the lower half of your body. You may be able to move your legs but will not be allowed to walk. Epidurals can be used for labor, delivery, or cesarean deliveries.  To prevent the medicine from wearing off, a small tube (catheter) may be threaded into the epidural space and taped in place to prevent it from slipping out. Medicine can then be given continuously in small doses through the tube until you deliver. Spinal Block A spinal block is similar to an epidural, but the medicine is injected into the spinal fluid, not the epidural space. A spinal block is only given   once. It starts to relieve pain quickly but lasts only 1-2 hours. Spinal blocks can also be used for cesarean deliveries.  Combined Spinal-Epidural Block Combined spinal-epidural blocks combine the benefits of both the spinal and epidural blocks. The spinal part acts quickly to relieve pain and the epidural provides continuous pain relief. Hydrotherapy Immersion in  warm water during labor may provide comfort and relaxation. It may also help to lessen pain, the use of anesthesia, and the length of labor. However, immersion in water during the delivery (water birth) may have some risk involved and studies to determine safety and risks are ongoing. If you are a healthy woman who is expecting an uncomplicated birth, talk with your health care provider to see if water birth is an option for you.    This information is not intended to replace advice given to you by your health care provider. Make sure you discuss any questions you have with your health care provider.   Document Released: 10/08/2008 Document Revised: 06/27/2013 Document Reviewed: 11/10/2012 Elsevier Interactive Patient Education 2016 [employer]  

## 2015-05-23 NOTE — Progress Notes (Signed)
Subjective:  Tiffany Wilkinson is a 29 y.o. Z6X0960G4P2012 at 6934w4d being seen today for ongoing prenatal care.  Patient reports no complaints.  Contractions: Irregular.  Vag. Bleeding: None. Movement: Present. Denies leaking of fluid.   The following portions of the patient's history were reviewed and updated as appropriate: allergies, current medications, past family history, past medical history, past social history, past surgical history and problem list.   Objective:   Filed Vitals:   05/23/15 1418  BP: 110/63  Pulse: 93  Temp: 98.7 F (37.1 C)  Weight: 178 lb 14.4 oz (81.149 kg)    Fetal Status: Fetal Heart Rate (bpm): 152   Movement: Present     General:  Alert, oriented and cooperative. Patient is in no acute distress.  Skin: Skin is warm and dry. No rash noted.   Cardiovascular: Normal heart rate noted  Respiratory: Normal respiratory effort, no problems with respiration noted  Abdomen: Soft, gravid, appropriate for gestational age. Pain/Pressure: Present     Pelvic: Vag. Bleeding: None     Cervical exam deferred        Extremities: Normal range of motion.  Edema: None  Mental Status: Normal mood and affect. Normal behavior. Normal judgment and thought content.   Urinalysis:      Assessment and Plan:  Pregnancy: A5W0981G4P2012 at 1434w4d  1. Supervision of high risk pregnancy, antepartum, unspecified trimester Tdap today - Glucose Tolerance, 1 HR (50g) w/o Fasting - CBC - RPR - HIV antibody (with reflex)  Preterm labor symptoms and general obstetric precautions including but not limited to vaginal bleeding, contractions, leaking of fluid and fetal movement were reviewed in detail with the patient. Please refer to After Visit Summary for other counseling recommendations.  Return in about 2 weeks (around 06/06/2015) for High Risk [facility].   Bertram DenverKaren E Teague Clark, PA-C

## 2015-05-24 LAB — RPR

## 2015-05-24 LAB — GLUCOSE TOLERANCE, 1 HOUR (50G) W/O FASTING: Glucose, 1 Hour GTT: 119 mg/dL (ref 70–140)

## 2015-05-24 LAB — HIV ANTIBODY (ROUTINE TESTING W REFLEX): HIV: NONREACTIVE

## 2015-06-06 ENCOUNTER — Encounter: Payer: Medicaid Other | Admitting: Obstetrics & Gynecology

## 2015-06-13 ENCOUNTER — Ambulatory Visit (INDEPENDENT_AMBULATORY_CARE_PROVIDER_SITE_OTHER): Payer: Medicaid Other | Admitting: Obstetrics & Gynecology

## 2015-06-13 ENCOUNTER — Other Ambulatory Visit (HOSPITAL_COMMUNITY)
Admission: RE | Admit: 2015-06-13 | Discharge: 2015-06-13 | Disposition: A | Discharge status: Home / Self Care | Payer: Medicaid Other | Source: Ambulatory Visit | Attending: Obstetrics & Gynecology | Admitting: Obstetrics & Gynecology

## 2015-06-13 VITALS — BP 115/66 | HR 81 | Temp 98.6°F | Wt 187.0 lb

## 2015-06-13 DIAGNOSIS — O0993 Supervision of high risk pregnancy, unspecified, third trimester: Secondary | ICD-10-CM | POA: Diagnosis not present

## 2015-06-13 DIAGNOSIS — Z113 Encounter for screening for infections with a predominantly sexual mode of transmission: Secondary | ICD-10-CM | POA: Insufficient documentation

## 2015-06-13 LAB — POCT URINALYSIS DIP (DEVICE)
Bilirubin Urine: NEGATIVE
Glucose, UA: NEGATIVE mg/dL
Ketones, ur: NEGATIVE mg/dL
Leukocytes, UA: NEGATIVE
Nitrite: NEGATIVE
PH: 7 (ref 5.0–8.0)
PROTEIN: NEGATIVE mg/dL
Specific Gravity, Urine: 1.02 (ref 1.005–1.030)
Urobilinogen, UA: 1 mg/dL (ref 0.0–1.0)

## 2015-06-13 LAB — OB RESULTS CONSOLE GC/CHLAMYDIA: GC PROBE AMP, GENITAL: NEGATIVE

## 2015-06-13 LAB — OB RESULTS CONSOLE GBS: GBS: NEGATIVE

## 2015-06-13 NOTE — Progress Notes (Signed)
Subjective:  Tiffany Wilkinson is a 29 y.o. N8G9562G4P2012 at [redacted]w[redacted]d being seen today for ongoing prenatal care.  She is currently monitored for the following issues for this high-risk pregnancy and has Supervision of high risk pregnancy, antepartum; Chronic foot pain; and Opioid dependence, daily use (HCC) on her problem list.  Patient reports no complaints.  Contractions: Irregular. Vag. Bleeding: None.  Movement: Present. Denies leaking of fluid.   The following portions of the patient's history were reviewed and updated as appropriate: allergies, current medications, past family history, past medical history, past social history, past surgical history and problem list. Problem list updated.  Objective:   Filed Vitals:   06/13/15 1058  BP: 115/66  Pulse: 81  Temp: 98.6 F (37 C)  Weight: 187 lb (84.823 kg)    Fetal Status: Fetal Heart Rate (bpm): 130 Fundal Height: 36 cm Movement: Present  Presentation: Vertex  General:  Alert, oriented and cooperative. Patient is in no acute distress.  Skin: Skin is warm and dry. No rash noted.   Cardiovascular: Normal heart rate noted  Respiratory: Normal respiratory effort, no problems with respiration noted  Abdomen: Soft, gravid, appropriate for gestational age. Pain/Pressure: Present     Pelvic: Vag. Bleeding: None    Cervical exam performed Dilation: Fingertip Effacement (%): Thick Station: -3  Extremities: Normal range of motion.  Edema: None  Mental Status: Normal mood and affect. Normal behavior. Normal judgment and thought content.   Urinalysis: Urine Protein: Negative Urine Glucose: Negative  Assessment and Plan:  Pregnancy: [id number] at 3141w4d  Supervision of high risk pregnancy, antepartum, third trimester Pelvic cultures - GC/Chlamydia probe amp [facility] - Culture, beta strep (group b only) Preterm labor symptoms and general obstetric precautions including but not limited to vaginal bleeding, contractions, leaking of fluid  and fetal movement were reviewed in detail with the patient. Please refer to After Visit Summary for other counseling recommendations.  Return in about 1 week (around 06/20/2015) for OB Visit.   Tereso NewcomerUgonna A Anyanwu, MD

## 2015-06-13 NOTE — Patient Instructions (Signed)
Return to clinic for any obstetric concerns or go to [facility] for evaluation  

## 2015-06-13 NOTE — Progress Notes (Signed)
Urine: trace hgb Cultures today Breastfeeding tip of the week reviewed

## 2015-06-14 LAB — GC/CHLAMYDIA PROBE AMP (~~LOC~~) NOT AT ARMC
Chlamydia: NEGATIVE
Neisseria Gonorrhea: NEGATIVE

## 2015-06-15 LAB — CULTURE, BETA STREP (GROUP B ONLY)

## 2015-06-20 ENCOUNTER — Ambulatory Visit (INDEPENDENT_AMBULATORY_CARE_PROVIDER_SITE_OTHER): Payer: Medicaid Other | Admitting: Obstetrics & Gynecology

## 2015-06-20 VITALS — BP 112/70 | HR 92 | Temp 98.7°F | Wt 183.0 lb

## 2015-06-20 DIAGNOSIS — F1120 Opioid dependence, uncomplicated: Secondary | ICD-10-CM | POA: Diagnosis not present

## 2015-06-20 DIAGNOSIS — O0993 Supervision of high risk pregnancy, unspecified, third trimester: Secondary | ICD-10-CM

## 2015-06-20 LAB — POCT URINALYSIS DIP (DEVICE)
Bilirubin Urine: NEGATIVE
Glucose, UA: NEGATIVE mg/dL
HGB URINE DIPSTICK: NEGATIVE
Ketones, ur: NEGATIVE mg/dL
NITRITE: NEGATIVE
Protein, ur: NEGATIVE mg/dL
SPECIFIC GRAVITY, URINE: 1.02 (ref 1.005–1.030)
UROBILINOGEN UA: 1 mg/dL (ref 0.0–1.0)
pH: 7.5 (ref 5.0–8.0)

## 2015-06-20 NOTE — Patient Instructions (Signed)
Return to clinic for any obstetric concerns or go to [facility] for evaluation  

## 2015-06-20 NOTE — Progress Notes (Signed)
Subjective:  Tiffany Wilkinson is a 29 y.o. [id number] at 6029w4d being seen today for ongoing prenatal care.  She is currently monitored for the following issues for this high-risk pregnancy and has Supervision of high risk pregnancy, antepartum; Chronic foot pain; and Opioid dependence, daily use (HCC) on her problem list.  Patient reports no complaints.  Contractions: Irregular. Vag. Bleeding: None.  Movement: Present. Denies leaking of fluid.   The following portions of the patient's history were reviewed and updated as appropriate: allergies, current medications, past family history, past medical history, past social history, past surgical history and problem list. Problem list updated.  Objective:   Filed Vitals:   06/20/15 1030  BP: 112/70  Pulse: 92  Temp: 98.7 F (37.1 C)  Weight: 183 lb (83.008 kg)    Fetal Status: Fetal Heart Rate (bpm): 134 Fundal Height: 37 cm Movement: Present     General:  Alert, oriented and cooperative. Patient is in no acute distress.  Skin: Skin is warm and dry. No rash noted.   Cardiovascular: Normal heart rate noted  Respiratory: Normal respiratory effort, no problems with respiration noted  Abdomen: Soft, gravid, appropriate for gestational age. Pain/Pressure: Present     Pelvic: Vag. Bleeding: None    Cervical exam deferred        Extremities: Normal range of motion.  Edema: None  Mental Status: Normal mood and affect. Normal behavior. Normal judgment and thought content.   Urinalysis: Urine Protein: Negative Urine Glucose: Negative  Assessment and Plan:  Pregnancy: A5W0981G4P2012 at 629w4d  1. Opioid dependence, daily use (HCC) Continue Percocet as needed for chronic foot pain  2. Supervision of high risk pregnancy, antepartum, third trimester Preterm labor symptoms and general obstetric precautions including but not limited to vaginal bleeding, contractions, leaking of fluid and fetal movement were reviewed in detail with the patient. Please refer to  After Visit Summary for other counseling recommendations.  Return in about 1 week (around 06/27/2015) for OB Visit.   Tereso NewcomerUgonna A Anyanwu, MD

## 2015-06-27 ENCOUNTER — Ambulatory Visit (INDEPENDENT_AMBULATORY_CARE_PROVIDER_SITE_OTHER): Payer: Medicaid Other | Admitting: Family

## 2015-06-27 VITALS — BP 118/70 | HR 96 | Temp 98.4°F | Wt 183.4 lb

## 2015-06-27 DIAGNOSIS — O99323 Drug use complicating pregnancy, third trimester: Secondary | ICD-10-CM

## 2015-06-27 DIAGNOSIS — F1110 Opioid abuse, uncomplicated: Secondary | ICD-10-CM | POA: Diagnosis not present

## 2015-06-27 LAB — POCT URINALYSIS DIP (DEVICE)
Bilirubin Urine: NEGATIVE
GLUCOSE, UA: NEGATIVE mg/dL
Hgb urine dipstick: NEGATIVE
KETONES UR: 15 mg/dL — AB
Nitrite: NEGATIVE
Protein, ur: NEGATIVE mg/dL
SPECIFIC GRAVITY, URINE: 1.02 (ref 1.005–1.030)
UROBILINOGEN UA: 1 mg/dL (ref 0.0–1.0)
pH: 7 (ref 5.0–8.0)

## 2015-06-27 NOTE — Addendum Note (Signed)
Addended by: Charlynn CourtGILLIAM, Gregorio Worley W on: 06/27/2015 11:01 AM   Modules accepted: Orders

## 2015-06-27 NOTE — Progress Notes (Signed)
Subjective:  Tiffany Wilkinson is a 29 y.o. [id number] at 5610w4d being seen today for ongoing prenatal care.  She is currently monitored for the following issues for this high-risk pregnancy and has Supervision of high risk pregnancy, antepartum; Chronic foot pain; and Opioid dependence, daily use (HCC) on her problem list.  Patient reports no complaints.  Contractions: Irregular. Vag. Bleeding: None.  Movement: Present. Denies leaking of fluid.   The following portions of the patient's history were reviewed and updated as appropriate: allergies, current medications, past family history, past medical history, past social history, past surgical history and problem list. Problem list updated.  Objective:   Filed Vitals:   06/27/15 1034  BP: 118/70  Pulse: 96  Temp: 98.4 F (36.9 C)  Weight: 83.19 kg (183 lb 6.4 oz)    Fetal Status: Fetal Heart Rate (bpm): 132 Fundal Height: 38 cm Movement: Present  Presentation: Vertex  General:  Alert, oriented and cooperative. Patient is in no acute distress.  Skin: Skin is warm and dry. No rash noted.   Cardiovascular: Normal heart rate noted  Respiratory: Normal respiratory effort, no problems with respiration noted  Abdomen: Soft, gravid, appropriate for gestational age. Pain/Pressure: Present     Pelvic: Vag. Bleeding: None     Cervical exam deferred Dilation: Fingertip Effacement (%): Thick    Extremities: Normal range of motion.  Edema: None  Mental Status: Normal mood and affect. Normal behavior. Normal judgment and thought content.   Urinalysis: Urine Protein: Negative Urine Glucose: Negative  Assessment and Plan:  Pregnancy: [id number] at 4810w4d  1. Opioid abuse, daily use - US MFM OB FOLLOW UP; Future  Term labor symptoms and general obstetric precautions including but not limited to vaginal bleeding, contractions, leaking of fluid and fetal movement were reviewed in detail with the patient. Please refer to After Visit Summary for other  counseling recommendations.  No Follow-up on file.   Eino FarberWalidah Kennith GainN Karim, CNM

## 2015-06-27 NOTE — Progress Notes (Signed)
Growth U/S 07/05/15 @ 2p with MFM.

## 2015-07-05 ENCOUNTER — Other Ambulatory Visit: Payer: Self-pay | Admitting: Family

## 2015-07-05 ENCOUNTER — Ambulatory Visit (HOSPITAL_COMMUNITY)
Admission: RE | Admit: 2015-07-05 | Discharge: 2015-07-05 | Disposition: A | Discharge status: Home / Self Care | Payer: Medicaid Other | Source: Ambulatory Visit | Attending: Family | Admitting: Family

## 2015-07-05 DIAGNOSIS — O99323 Drug use complicating pregnancy, third trimester: Secondary | ICD-10-CM | POA: Diagnosis not present

## 2015-07-05 DIAGNOSIS — F1920 Other psychoactive substance dependence, uncomplicated: Secondary | ICD-10-CM | POA: Insufficient documentation

## 2015-07-05 DIAGNOSIS — F1110 Opioid abuse, uncomplicated: Secondary | ICD-10-CM

## 2015-07-05 DIAGNOSIS — Z3A38 38 weeks gestation of pregnancy: Secondary | ICD-10-CM | POA: Diagnosis not present

## 2015-07-07 NOTE — L&D Delivery Note (Signed)
Delivery Note At 10:14 AM a viable female was delivered via Vaginal, Spontaneous Delivery (Presentation: Right Occiput Anterior).  APGAR: 6, 9; weight  .   Placenta status: Intact, Spontaneous.  Cord: 3 vessels with the following complications: None.  Cord pH: pending  Anesthesia: Epidural  Episiotomy: None Lacerations: 1st degree Suture Repair: 3.0 vicryl Est. Blood Loss (mL):  100 mL  Mom to postpartum.  Baby to Nursery.  STINSON, JACOB JEHIEL 07/15/2015, 10:30 AM

## 2015-07-08 ENCOUNTER — Encounter: Payer: Self-pay | Admitting: Obstetrics & Gynecology

## 2015-07-08 ENCOUNTER — Ambulatory Visit (INDEPENDENT_AMBULATORY_CARE_PROVIDER_SITE_OTHER): Payer: Medicaid Other | Admitting: Obstetrics & Gynecology

## 2015-07-08 VITALS — BP 126/68 | HR 84 | Temp 98.4°F | Wt 182.2 lb

## 2015-07-08 DIAGNOSIS — O99323 Drug use complicating pregnancy, third trimester: Secondary | ICD-10-CM

## 2015-07-08 DIAGNOSIS — F1120 Opioid dependence, uncomplicated: Secondary | ICD-10-CM

## 2015-07-08 DIAGNOSIS — O0993 Supervision of high risk pregnancy, unspecified, third trimester: Secondary | ICD-10-CM

## 2015-07-08 LAB — POCT URINALYSIS DIP (DEVICE)
Bilirubin Urine: NEGATIVE
Glucose, UA: NEGATIVE mg/dL
Ketones, ur: NEGATIVE mg/dL
NITRITE: NEGATIVE
PH: 6.5 (ref 5.0–8.0)
PROTEIN: NEGATIVE mg/dL
Specific Gravity, Urine: 1.025 (ref 1.005–1.030)
UROBILINOGEN UA: 1 mg/dL (ref 0.0–1.0)

## 2015-07-08 NOTE — Patient Instructions (Signed)
Vaginal Delivery °During delivery, your health care provider will help you give birth to your baby. During a vaginal delivery, you will work to push the baby out of your vagina. However, before you can push your baby out, a few things need to happen. The opening of your uterus (cervix) has to soften, thin out, and open up (dilate) all the way to 10 cm. Also, your baby has to move down from the uterus into your vagina.  °SIGNS OF LABOR  °Your health care provider will first need to make sure you are in labor. Signs of labor include:  °· Passing what is called the mucous plug before labor begins. This is a small amount of blood-stained mucus. °· Having regular, painful uterine contractions.   °· The time between contractions gets shorter.   °· The discomfort and pain gradually get more intense. °· Contraction pains get worse when walking and do not go away when resting.   °· Your cervix becomes thinner (effacement) and dilates. °BEFORE THE DELIVERY °Once you are in labor and admitted into the hospital or care center, your health care provider may do the following:  °· Perform a complete physical exam. °· Review any complications related to pregnancy or labor.  °· Check your blood pressure, pulse, temperature, and heart rate (vital signs).   °· Determine if, and when, the rupture of amniotic membranes occurred. °· Do a vaginal exam (using a sterile glove and lubricant) to determine:   °¨ The position (presentation) of the baby. Is the baby's head presenting first (vertex) in the birth canal (vagina), or are the feet or buttocks first (breech)?   °¨ The level (station) of the baby's head within the birth canal.   °¨ The effacement and dilatation of the cervix.   °· An electronic fetal monitor is usually placed on your abdomen when you first arrive. This is used to monitor your contractions and the baby's heart rate. °¨ When the monitor is on your abdomen (external fetal monitor), it can only pick up the frequency and  length of your contractions. It cannot tell the strength of your contractions. °¨ If it becomes necessary for your health care provider to know exactly how strong your contractions are or to see exactly what the baby's heart rate is doing, an internal monitor may be inserted into your vagina and uterus. Your health care provider will discuss the benefits and risks of using an internal monitor and obtain your permission before inserting the device. °¨ Continuous fetal monitoring may be needed if you have an epidural, are receiving certain medicines (such as oxytocin), or have pregnancy or labor complications. °· An IV access tube may be placed into a vein in your arm to deliver fluids and medicines if necessary. °THREE STAGES OF LABOR AND DELIVERY °Normal labor and delivery is divided into three stages. °First Stage °This stage starts when you begin to contract regularly and your cervix begins to efface and dilate. It ends when your cervix is completely open (fully dilated). The first stage is the longest stage of labor and can last from 3 hours to 15 hours.  °Several methods are available to help with labor pain. You and your health care provider will decide which option is best for you. Options include:  °· Opioid medicines. These are strong pain medicines that you can get through your IV tube or as a shot into your muscle. These medicines lessen pain but do not make it go away completely.  °· Epidural. A medicine is given through a thin tube that   is inserted in your back. The medicine numbs the lower part of your body and prevents any pain in that area. °· Paracervical pain medicine. This is an injection of an anesthetic on each side of your cervix.   °· You may request natural childbirth, which does not involve the use of pain medicines or an epidural during labor and delivery. Instead, you will use other things, such as breathing exercises, to help cope with the pain. °Second Stage °The second stage of labor  begins when your cervix is fully dilated at 10 cm. It continues until you push your baby down through the birth canal and the baby is born. This stage can take only minutes or several hours. °· The location of your baby's head as it moves through the birth canal is reported as a number called a station. If the baby's head has not started its descent, the station is described as being at minus 3 (-3). When your baby's head is at the zero station, it is at the middle of the birth canal and is engaged in the pelvis. The station of your baby helps indicate the progress of the second stage of labor. °· When your baby is born, your health care provider may hold the baby with his or her head lowered to prevent amniotic fluid, mucus, and blood from getting into the baby's lungs. The baby's mouth and nose may be suctioned with a small bulb syringe to remove any additional fluid. °· Your health care provider may then place the baby on your stomach. It is important to keep the baby from getting cold. To do this, the health care provider will dry the baby off, place the baby directly on your skin (with no blankets between you and the baby), and cover the baby with warm, dry blankets.   °· The umbilical cord is cut. °Third Stage °During the third stage of labor, your health care provider will deliver the placenta (afterbirth) and make sure your bleeding is under control. The delivery of the placenta usually takes about 5 minutes but can take up to 30 minutes. After the placenta is delivered, a medicine may be given either by IV or injection to help contract the uterus and control bleeding. If you are planning to breastfeed, you can try to do so now. °After you deliver the placenta, your uterus should contract and get very firm. If your uterus does not remain firm, your health care provider will massage it. This is important because the contraction of the uterus helps cut off bleeding at the site where the placenta was attached  to your uterus. If your uterus does not contract properly and stay firm, you may continue to bleed heavily. If there is a lot of bleeding, medicines may be given to contract the uterus and stop the bleeding.  °  °This information is not intended to replace advice given to you by your health care provider. Make sure you discuss any questions you have with your health care provider. °  °Document Released: 03/31/2008 Document Revised: 07/13/2014 Document Reviewed: 02/17/2012 °Elsevier Interactive Patient Education ©2016 [employer] ° °

## 2015-07-08 NOTE — Progress Notes (Signed)
Subjective: pressure  Tiffany Wilkinson is a 30 y.o. [id number] at 7673w1d being seen today for ongoing prenatal care.  She is currently monitored for the following issues for this high-risk pregnancy and has Supervision of high risk pregnancy, antepartum; Chronic foot pain; and Opioid dependence, daily use (HCC) on her problem list.  Patient reports occasional contractions.  Contractions: Irregular. Vag. Bleeding: None.  Movement: Present. Denies leaking of fluid.   The following portions of the patient's history were reviewed and updated as appropriate: allergies, current medications, past family history, past medical history, past social history, past surgical history and problem list. Problem list updated.  Objective:   Filed Vitals:   07/08/15 1046  BP: 126/68  Pulse: 84  Temp: 98.4 F (36.9 C)  Weight: 182 lb 3.2 oz (82.645 kg)    Fetal Status: Fetal Heart Rate (bpm): 125   Movement: Present     General:  Alert, oriented and cooperative. Patient is in no acute distress.  Skin: Skin is warm and dry. No rash noted.   Cardiovascular: Normal heart rate noted  Respiratory: Normal respiratory effort, no problems with respiration noted  Abdomen: Soft, gravid, appropriate for gestational age. Pain/Pressure: Present     Pelvic: Vag. Bleeding: None Vag D/C Character: White   Cervical exam performed        Extremities: Normal range of motion.  Edema: None  Mental Status: Normal mood and affect. Normal behavior. Normal judgment and thought content.   Urinalysis: Urine Protein: Negative Urine Glucose: Negative  Assessment and Plan:  Pregnancy: [id number] at 1773w1d  There are no diagnoses linked to this encounter. Term labor symptoms and general obstetric precautions including but not limited to vaginal bleeding, contractions, leaking of fluid and fetal movement were reviewed in detail with the patient. Please refer to After Visit Summary for other counseling recommendations.  Return in about 1  week (around 07/15/2015).   Adam PhenixJames G Rubby Barbary, MD

## 2015-07-15 ENCOUNTER — Encounter (HOSPITAL_COMMUNITY): Payer: Self-pay | Admitting: *Deleted

## 2015-07-15 ENCOUNTER — Inpatient Hospital Stay (HOSPITAL_COMMUNITY): Payer: Medicaid Other | Admitting: Anesthesiology

## 2015-07-15 ENCOUNTER — Inpatient Hospital Stay (HOSPITAL_COMMUNITY)
Admission: AD | Admit: 2015-07-15 | Discharge: 2015-07-17 | DRG: 775 | Disposition: A | Discharge status: Home / Self Care | Payer: Medicaid Other | Source: Ambulatory Visit | Attending: Family Medicine | Admitting: Family Medicine

## 2015-07-15 ENCOUNTER — Encounter: Payer: Medicaid Other | Admitting: Obstetrics & Gynecology

## 2015-07-15 DIAGNOSIS — IMO0001 Reserved for inherently not codable concepts without codable children: Secondary | ICD-10-CM

## 2015-07-15 DIAGNOSIS — Z8042 Family history of malignant neoplasm of prostate: Secondary | ICD-10-CM

## 2015-07-15 DIAGNOSIS — Z811 Family history of alcohol abuse and dependence: Secondary | ICD-10-CM | POA: Diagnosis not present

## 2015-07-15 DIAGNOSIS — Z87891 Personal history of nicotine dependence: Secondary | ICD-10-CM

## 2015-07-15 DIAGNOSIS — O0993 Supervision of high risk pregnancy, unspecified, third trimester: Secondary | ICD-10-CM

## 2015-07-15 DIAGNOSIS — Z3A40 40 weeks gestation of pregnancy: Secondary | ICD-10-CM

## 2015-07-15 DIAGNOSIS — Z825 Family history of asthma and other chronic lower respiratory diseases: Secondary | ICD-10-CM | POA: Diagnosis not present

## 2015-07-15 DIAGNOSIS — O480 Post-term pregnancy: Principal | ICD-10-CM | POA: Diagnosis present

## 2015-07-15 DIAGNOSIS — Z800 Family history of malignant neoplasm of digestive organs: Secondary | ICD-10-CM

## 2015-07-15 DIAGNOSIS — F1190 Opioid use, unspecified, uncomplicated: Secondary | ICD-10-CM

## 2015-07-15 DIAGNOSIS — O99324 Drug use complicating childbirth: Secondary | ICD-10-CM

## 2015-07-15 LAB — CBC
HCT: 38.3 % (ref 36.0–46.0)
Hemoglobin: 13.1 g/dL (ref 12.0–15.0)
MCH: 30.3 pg (ref 26.0–34.0)
MCHC: 34.2 g/dL (ref 30.0–36.0)
MCV: 88.5 fL (ref 78.0–100.0)
PLATELETS: 259 10*3/uL (ref 150–400)
RBC: 4.33 MIL/uL (ref 3.87–5.11)
RDW: 12.9 % (ref 11.5–15.5)
WBC: 13.3 10*3/uL — ABNORMAL HIGH (ref 4.0–10.5)

## 2015-07-15 LAB — TYPE AND SCREEN
ABO/RH(D): A POS
Antibody Screen: NEGATIVE

## 2015-07-15 LAB — ABO/RH: ABO/RH(D): A POS

## 2015-07-15 MED ORDER — LACTATED RINGERS IV SOLN: 500.0000 mL | INTRAVENOUS | Status: DC | PRN

## 2015-07-15 MED ORDER — OXYCODONE-ACETAMINOPHEN 5-325 MG PO TABS: 2.0000 | ORAL_TABLET | ORAL | Status: DC | PRN

## 2015-07-15 MED ORDER — ZOLPIDEM TARTRATE 5 MG PO TABS: 5.0000 mg | ORAL_TABLET | Freq: Every evening | ORAL | Status: DC | PRN

## 2015-07-15 MED ORDER — DIPHENHYDRAMINE HCL 25 MG PO CAPS: 25.0000 mg | ORAL_CAPSULE | Freq: Four times a day (QID) | ORAL | Status: DC | PRN

## 2015-07-15 MED ORDER — ONDANSETRON HCL 4 MG PO TABS
4.0000 mg | ORAL_TABLET | ORAL | Status: DC | PRN
Start: 2015-07-15 — End: 2015-07-17

## 2015-07-15 MED ORDER — BENZOCAINE-MENTHOL 20-0.5 % EX AERO
1.0000 "application " | INHALATION_SPRAY | CUTANEOUS | Status: DC | PRN
  Administered 2015-07-15: 1 via TOPICAL
  Filled 2015-07-15: qty 56

## 2015-07-15 MED ORDER — CITRIC ACID-SODIUM CITRATE 334-500 MG/5ML PO SOLN: 30.0000 mL | ORAL | Status: DC | PRN

## 2015-07-15 MED ORDER — WITCH HAZEL-GLYCERIN EX PADS: 1.0000 "application " | MEDICATED_PAD | CUTANEOUS | Status: DC | PRN

## 2015-07-15 MED ORDER — DIPHENHYDRAMINE HCL 50 MG/ML IJ SOLN: 12.5000 mg | INTRAMUSCULAR | Status: DC | PRN

## 2015-07-15 MED ORDER — LACTATED RINGERS IV SOLN
INTRAVENOUS | Status: DC
  Administered 2015-07-15: 09:00:00 via INTRAVENOUS

## 2015-07-15 MED ORDER — OXYCODONE-ACETAMINOPHEN 5-325 MG PO TABS: 1.0000 | ORAL_TABLET | Freq: Four times a day (QID) | ORAL | Status: DC | PRN

## 2015-07-15 MED ORDER — OXYTOCIN BOLUS FROM INFUSION
500.0000 mL | INTRAVENOUS | Status: DC
  Administered 2015-07-15: 500 mL via INTRAVENOUS

## 2015-07-15 MED ORDER — IBUPROFEN 600 MG PO TABS
600.0000 mg | ORAL_TABLET | Freq: Four times a day (QID) | ORAL | Status: DC
  Administered 2015-07-15 – 2015-07-17 (×8): 600 mg via ORAL
  Filled 2015-07-15 (×8): qty 1

## 2015-07-15 MED ORDER — PHENYLEPHRINE 40 MCG/ML (10ML) SYRINGE FOR IV PUSH (FOR BLOOD PRESSURE SUPPORT)
80.0000 ug | PREFILLED_SYRINGE | INTRAVENOUS | Status: DC | PRN
  Filled 2015-07-15: qty 2
  Filled 2015-07-15: qty 20

## 2015-07-15 MED ORDER — TETANUS-DIPHTH-ACELL PERTUSSIS 5-2.5-18.5 LF-MCG/0.5 IM SUSP: 0.5000 mL | Freq: Once | INTRAMUSCULAR | Status: DC

## 2015-07-15 MED ORDER — CYCLOBENZAPRINE HCL 5 MG PO TABS
5.0000 mg | ORAL_TABLET | Freq: Three times a day (TID) | ORAL | Status: DC | PRN
  Filled 2015-07-15: qty 1

## 2015-07-15 MED ORDER — SIMETHICONE 80 MG PO CHEW: 80.0000 mg | CHEWABLE_TABLET | ORAL | Status: DC | PRN

## 2015-07-15 MED ORDER — FLEET ENEMA 7-19 GM/118ML RE ENEM: 1.0000 | ENEMA | RECTAL | Status: DC | PRN

## 2015-07-15 MED ORDER — ACETAMINOPHEN 325 MG PO TABS
650.0000 mg | ORAL_TABLET | ORAL | Status: DC | PRN
  Administered 2015-07-15: 650 mg via ORAL
  Filled 2015-07-15: qty 2

## 2015-07-15 MED ORDER — LIDOCAINE HCL (PF) 1 % IJ SOLN
30.0000 mL | INTRAMUSCULAR | Status: DC | PRN
  Administered 2015-07-15: 30 mL via SUBCUTANEOUS
  Filled 2015-07-15: qty 30

## 2015-07-15 MED ORDER — OXYTOCIN 10 UNIT/ML IJ SOLN
2.5000 [IU]/h | INTRAVENOUS | Status: DC
  Administered 2015-07-15: 2500 m[IU]/h via INTRAVENOUS
  Filled 2015-07-15: qty 4

## 2015-07-15 MED ORDER — LIDOCAINE HCL (PF) 1 % IJ SOLN
INTRAMUSCULAR | Status: DC | PRN
  Administered 2015-07-15 (×2): 8 mL via EPIDURAL

## 2015-07-15 MED ORDER — ONDANSETRON HCL 4 MG/2ML IJ SOLN: 4.0000 mg | INTRAMUSCULAR | Status: DC | PRN

## 2015-07-15 MED ORDER — DIBUCAINE 1 % RE OINT: 1.0000 "application " | TOPICAL_OINTMENT | RECTAL | Status: DC | PRN

## 2015-07-15 MED ORDER — INFLUENZA VAC SPLIT QUAD 0.5 ML IM SUSY: 0.5000 mL | PREFILLED_SYRINGE | INTRAMUSCULAR | Status: DC

## 2015-07-15 MED ORDER — SENNOSIDES-DOCUSATE SODIUM 8.6-50 MG PO TABS
2.0000 | ORAL_TABLET | ORAL | Status: DC
  Administered 2015-07-15 – 2015-07-16 (×2): 2 via ORAL
  Filled 2015-07-15 (×2): qty 2

## 2015-07-15 MED ORDER — OXYCODONE-ACETAMINOPHEN 5-325 MG PO TABS: 1.0000 | ORAL_TABLET | ORAL | Status: DC | PRN

## 2015-07-15 MED ORDER — ONDANSETRON HCL 4 MG/2ML IJ SOLN: 4.0000 mg | Freq: Four times a day (QID) | INTRAMUSCULAR | Status: DC | PRN

## 2015-07-15 MED ORDER — PRENATAL MULTIVITAMIN CH
1.0000 | ORAL_TABLET | Freq: Every day | ORAL | Status: DC
  Administered 2015-07-15 – 2015-07-16 (×2): 1 via ORAL
  Filled 2015-07-15 (×2): qty 1

## 2015-07-15 MED ORDER — LANOLIN HYDROUS EX OINT: TOPICAL_OINTMENT | CUTANEOUS | Status: DC | PRN

## 2015-07-15 MED ORDER — EPHEDRINE 5 MG/ML INJ
10.0000 mg | INTRAVENOUS | Status: DC | PRN
  Filled 2015-07-15: qty 2

## 2015-07-15 MED ORDER — ACETAMINOPHEN 325 MG PO TABS: 650.0000 mg | ORAL_TABLET | ORAL | Status: DC | PRN

## 2015-07-15 MED ORDER — FENTANYL 2.5 MCG/ML BUPIVACAINE 1/10 % EPIDURAL INFUSION (WH - ANES)
14.0000 mL/h | INTRAMUSCULAR | Status: DC | PRN
  Administered 2015-07-15: 14 mL/h via EPIDURAL
  Filled 2015-07-15: qty 125

## 2015-07-15 NOTE — Anesthesia Procedure Notes (Signed)
Epidural Patient location during procedure: OB Start time: 07/15/2015 9:55 AM End time: 07/15/2015 9:59 AM  Staffing Anesthesiologist: Leilani AbleHATCHETT, Jereline Ticer Performed by: anesthesiologist   Preanesthetic Checklist Completed: patient identified, surgical consent, pre-op evaluation, timeout performed, IV checked, risks and benefits discussed and monitors and equipment checked  Epidural Patient position: sitting Prep: site prepped and draped and DuraPrep Patient monitoring: continuous pulse ox and blood pressure Approach: midline Location: L3-L4 Injection technique: LOR air  Needle:  Needle type: Tuohy  Needle gauge: 17 G Needle length: 9 cm and 9 Needle insertion depth: 5 cm cm Catheter type: closed end flexible Catheter size: 19 Gauge Catheter at skin depth: 10 cm Test dose: negative and Other  Assessment Sensory level: T8 Events: blood not aspirated, injection not painful, no injection resistance, negative IV test and no paresthesia  Additional Notes Reason for block:procedure for pain

## 2015-07-15 NOTE — H&P (Signed)
LABOR ADMISSION HISTORY AND PHYSICAL  Tiffany Wilkinson is a 30 y.o. female [phone] [id number] with IUP at 7368w1d by 16w US presenting for SOL. Contractions began around 2 AM on day of presentation; every 5-6 minutes.  She reports +FM, + contractions, No LOF, no blurry vision, headaches or peripheral edema, and RUQ pain. Notes of some vaginal spotting (dark brown) today.  She plans on breast feeding. She is undecided about birth control   Dating: By 3893w2d [place] --->  Estimated Date of Delivery: 07/14/15  Sono:    @[redacted]w[redacted]d , CWD, normal anatomy, Cephalic presentation, 3584g, 13%83% EFW   Prenatal History/Complications: Oxycodone use for pain from foot injury  Past Medical History: Past Medical History  Diagnosis Date  . Bacterial vaginosis   . Urinary tract infection   . Chlamydia   . Infection     UTI X 1  . Infection 2009    GC  . Infection     YEAST OCC  . Infection     FREQUENT BV  . Infection 2001    CHLAMYDIA    Past Surgical History: Past Surgical History  Procedure Laterality Date  . Induced abortion      Obstetrical History: OB History    Gravida Para Term Preterm AB TAB SAB Ectopic Multiple Living   4 2 2  0 1 0 1 0 0 2      Social History: Social History   Social History  . Marital Status: Single    Spouse Name: N/A  . Number of Children: 1  . Years of Education: 16   Occupational History  . [employer]    Social History Main Topics  . Smoking status: Former Smoker    Types: Cigarettes    Quit date: 01/13/2011  . Smokeless tobacco: Never Used     Comment: 2008  . Alcohol Use: Yes     Comment: none with pregnancy  . Drug Use: No  . Sexual Activity:    Partners: Male    Birth Control/ Protection: None   Other Topics Concern  . None   Social History Narrative    Family History: Family History  Problem Relation Age of Onset  . Anesthesia problems Neg Hx   . Alcohol abuse Mother   . Asthma Father   . Asthma Paternal Aunt   . Other Maternal Grandmother      STOMACH ULCER  . Mitral valve prolapse Maternal Grandmother   . Cancer Paternal Grandfather     PROSTATE  . Diabetes Paternal Grandfather     Allergies: No Known Allergies  Prescriptions prior to admission  Medication Sig Dispense Refill Last Dose  . oxyCODONE-acetaminophen (PERCOCET) 7.5-325 MG tablet Take 1 tablet by mouth every 8 (eight) hours as needed for severe pain. 30 tablet 0 Taking  . Prenatal Vit-Fe Fumarate-FA (PRENATAL VITAMIN PO) Take by mouth.   Taking     Review of Systems   All systems reviewed and negative except as stated in HPI  BP 130/79 mmHg  Pulse 94  Temp(Src) 98 F (36.7 C) (Oral)  Resp 20  Ht 5\' 9"  (1.753 m)  Wt 83.008 kg (183 lb)  BMI 27.01 kg/m2  LMP 09/15/2014 General appearance: alert and cooperative Lungs: clear to auscultation bilaterally Heart: regular rate and rhythm Abdomen: soft, non-tender; bowel sounds normal Extremities: Homans sign is negative, no sign of DVT, edema Fetal monitoringBaseline: 145 bpm, Variability: Good {> 6 bpm), Accelerations: none and Decelerations: Variable: mild (about 4 over 18 minute period) Uterine activityFrequency: Every  3 minutes Dilation: 6 Effacement (%): 80 Station: -2 Exam by:: Henderson Newcomer, RN   Prenatal labs: ABO, Rh:   pos Antibody:   neg Rubella: Immune RPR: NON REAC (11/17 1519)  HBsAg: Negative (06/06 0000)  HIV: NONREACTIVE (11/17 1519)  GBS: Negative (12/08 0000)  1 hr Glucola 119 (third trimester) Genetic screening  Quad neg Anatomy US normal   Prenatal Transfer Tool  Maternal Diabetes: No Genetic Screening: Normal (quad) Maternal Ultrasounds/Referrals: Normal Fetal Ultrasounds or other Referrals:  None Maternal Substance Abuse:  Oxycodone use for foot injury  Significant Maternal Medications:  Meds include: Other: oxycodone Significant Maternal Lab Results: Lab values include: Group B Strep negative  Results for orders placed or performed during the hospital encounter  of 07/15/15 (from the past 24 hour(s))  CBC   Collection Time: 07/15/15  9:05 AM  Result Value Ref Range   WBC 13.3 (H) 4.0 - 10.5 K/uL   RBC 4.33 3.87 - 5.11 MIL/uL   Hemoglobin 13.1 12.0 - 15.0 g/dL   HCT 56.2 13.0 - 86.5 %   MCV 88.5 78.0 - 100.0 fL   MCH 30.3 26.0 - 34.0 pg   MCHC 34.2 30.0 - 36.0 g/dL   RDW 78.4 69.6 - 29.5 %   Platelets 259 150 - 400 K/uL    Patient Active Problem List   Diagnosis Date Noted  . Active labor at term 07/15/2015  . Opioid dependence, daily use (HCC) 05/06/2015  . Supervision of high risk pregnancy, antepartum 04/27/2015  . Chronic foot pain 04/27/2015    Assessment: Tiffany Wilkinson is a 30 y.o. [id number] at [redacted]w[redacted]d here for SOL  #Labor: Spontaneous  #Pain: Desires Epidural #FWB: Cat II (normal baseline with good variability, with some variable decelerations)  #ID: GBS negative  #MOF: Breast #MOC: uncertain #Circ:  outpt circ  Palma Holter, MD PGY1 Family Medicine   OB fellow attestation:  I have seen and examined this patient; I agree with above documentation in the resident's note.   Tiffany Wilkinson is a 30 y.o. [phone] here for active labor  PE: BP 134/71 mmHg  Pulse 89  Temp(Src) 99.9 F (37.7 C) (Oral)  Resp 18  Ht 5\' 9"  (1.753 m)  Wt 183 lb (83.008 kg)  BMI 27.01 kg/m2  SpO2 100%  LMP 09/15/2014  Breastfeeding? Unknown Gen: calm comfortable, NAD Resp: normal effort, no distress Abd: gravid  ROS, labs, PMH reviewed  Plan: Admit to LD, expectant management GBS neg, no need for abx Maternal opiate use: watch pp for infant withdrawal  Federico Flake, MD Family Medicine, OB Fellow 07/15/2015, 12:51 PM

## 2015-07-15 NOTE — Progress Notes (Signed)
UR chart review completed.  

## 2015-07-15 NOTE — MAU Note (Signed)
Pt states she started having U/C's since about 0200, now reports every 5 minutes.  Denies ROM but has some mucous and bloody show.  Good fetal movement.

## 2015-07-15 NOTE — Anesthesia Preprocedure Evaluation (Signed)
Anesthesia Evaluation  Patient identified by MRN, date of birth, ID band Patient awake    Reviewed: Allergy & Precautions, H&P , NPO status , Patient's Chart, lab work & pertinent test results  Airway Mallampati: I  TM Distance: >3 FB Neck ROM: full    Dental no notable dental hx.    Pulmonary former smoker,    Pulmonary exam normal        Cardiovascular negative cardio ROS Normal cardiovascular exam     Neuro/Psych negative neurological ROS  negative psych ROS   GI/Hepatic negative GI ROS, Neg liver ROS,   Endo/Other  negative endocrine ROS  Renal/GU negative Renal ROS     Musculoskeletal   Abdominal Normal abdominal exam  (+)   Peds  Hematology negative hematology ROS (+)   Anesthesia Other Findings   Reproductive/Obstetrics (+) Pregnancy                             Anesthesia Physical Anesthesia Plan  ASA: II  Anesthesia Plan: Epidural   Post-op Pain Management:    Induction:   Airway Management Planned:   Additional Equipment:   Intra-op Plan:   Post-operative Plan:   Informed Consent: I have reviewed the patients History and Physical, chart, labs and discussed the procedure including the risks, benefits and alternatives for the proposed anesthesia with the patient or authorized representative who has indicated his/her understanding and acceptance.     Plan Discussed with:   Anesthesia Plan Comments:         Anesthesia Quick Evaluation  

## 2015-07-15 NOTE — Anesthesia Postprocedure Evaluation (Signed)
Anesthesia Post Note  Patient: Tiffany Wilkinson  Procedure(s) Performed: * No procedures listed *  Patient location during evaluation: Mother Baby Anesthesia Type: Epidural Level of consciousness: awake and alert Pain management: pain level controlled Vital Signs Assessment: post-procedure vital signs reviewed and stable Respiratory status: spontaneous breathing Cardiovascular status: blood pressure returned to baseline and stable Postop Assessment: no headache, no backache, patient able to bend at knees, no signs of nausea or vomiting and adequate PO intake Anesthetic complications: no    Last Vitals:  Filed Vitals:   07/15/15 1146 07/15/15 1223  BP:  134/71  Pulse:  89  Temp: 37.4 C 37.7 C  Resp:  18    Last Pain: There were no vitals filed for this visit.               [name]

## 2015-07-16 LAB — CBC
HEMATOCRIT: 35.5 % — AB (ref 36.0–46.0)
Hemoglobin: 11.9 g/dL — ABNORMAL LOW (ref 12.0–15.0)
MCH: 29.9 pg (ref 26.0–34.0)
MCHC: 33.5 g/dL (ref 30.0–36.0)
MCV: 89.2 fL (ref 78.0–100.0)
PLATELETS: 218 10*3/uL (ref 150–400)
RBC: 3.98 MIL/uL (ref 3.87–5.11)
RDW: 12.9 % (ref 11.5–15.5)
WBC: 16.1 10*3/uL — AB (ref 4.0–10.5)

## 2015-07-16 LAB — RPR: RPR Ser Ql: NONREACTIVE

## 2015-07-16 NOTE — Lactation Note (Signed)
This note was copied from the chart of Tiffany Siri ColeDeandrea Crites. Lactation Consultation Note Experienced BF mom, her youngest is 32 yrs old which she BF for 4-5 months. Mom has good everted nipples. Mom states BF going well. She can tell if he's not getting enough food. Hand expression demonstrated to show mom colostrum flow. Easily hand expressed colostrum. Baby is being monitored NAS scores d/t substance abuse. Mom encouraged to feed baby 8-12 times/24 hours and with feeding cues. Referred to Baby and Me Book in Breastfeeding section Pg. 22-23 for position options and Proper latch demonstration.[facility]/LC brochure given w/resources, support groups and LC services.Hand expression taught to Mom.  Patient Name: Tiffany Siri ColeDeandrea Kuhnert ZOXWR'UToday's Date: 07/16/2015 Reason for consult: Initial assessment   Maternal Data Has patient been taught Hand Expression?: Yes Does the patient have breastfeeding experience prior to this delivery?: Yes  Feeding Feeding Type: Breast Fed Length of feed: 20 min  LATCH Score/Interventions Latch: Grasps breast easily, tongue down, lips flanged, rhythmical sucking.  Audible Swallowing: A few with stimulation  Type of Nipple: Everted at rest and after stimulation  Comfort (Breast/Nipple): Filling, red/small blisters or bruises, mild/mod discomfort  Problem noted: Mild/Moderate discomfort Interventions (Mild/moderate discomfort): Comfort gels;Hand massage;Hand expression  Hold (Positioning): No assistance needed to correctly position infant at breast. Intervention(s): Skin to skin;Position options;Support Pillows;Breastfeeding basics reviewed  LATCH Score: 9  Lactation Tools Discussed/Used WIC Program: Yes   Consult Status Consult Status: Follow-up Date: 07/17/15 Follow-up type: In-patient    Charyl DancerCARVER, Wenzel Backlund G 07/16/2015, 5:56 AM

## 2015-07-16 NOTE — Progress Notes (Signed)
CSW acknowledges consult for history of oxycodone use, and notes that the infant's UDS is positive for opiates. CSW screening out consult at this time since MOB is prescribed opiate medication due to chronic pain, no history of substance use noted.   CSW consulted with RN, who denied presence of any concerns.  Re-consult CSW if needs arise or upon family request.  Benz Vandenberghe MSW, LCSW [phone] 

## 2015-07-16 NOTE — Discharge Summary (Signed)
Obstetric Discharge Summary Reason for Admission: onset of labor Prenatal Procedures: none Intrapartum Procedures: spontaneous vaginal delivery Postpartum Procedures: none Complications-Operative and Postpartum: none HEMOGLOBIN  Date Value Ref Range Status  07/16/2015 11.9* 12.0 - 15.0 g/dL Final  11/91/478206/12/2014 95.613.0 g/dL Final   HCT  Date Value Ref Range Status  07/16/2015 35.5* 36.0 - 46.0 % Final  12/10/2014 39 % Final    Physical Exam:  General: alert and cooperative Lochia: appropriate Uterine Fundus: firm Incision:  DVT Evaluation: No evidence of DVT seen on physical exam. Negative Homan's sign.  Discharge Diagnoses: Term Pregnancy-delivered  Discharge Information: Date: 07/16/2015 Activity: unrestricted Diet: routine Medications: None Condition: stable Instructions: refer to practice specific booklet Discharge to: home Follow-up Information    [facility].   Specialty:  Obstetrics and Gynecology   Contact information:   [address] [place] [id number] [phone]      Newborn Data: Live born female  Birth Weight: 8 lb 7.3 oz (3836 g) APGAR: 6, 9  Home with mother.  Kahron Kauth Grissett 07/16/2015, 8:29 AM

## 2015-07-16 NOTE — Discharge Instructions (Signed)

## 2015-07-17 NOTE — Discharge Summary (Signed)
Obstetric Discharge Summary Reason for Admission: onset of labor Prenatal Procedures: none Intrapartum Procedures: spontaneous vaginal delivery Postpartum Procedures: none Complications-Operative and Postpartum: none  Hospital Course:  Patient as admitted for SOL. She progressed well without any complications.   Delivery Note At 10:14 AM a viable female was delivered via Vaginal, Spontaneous Delivery (Presentation: Right Occiput Anterior). APGAR: 6, 9; weight .  Placenta status: Intact, Spontaneous. Cord: 3 vessels with the following complications: None. Cord pH: pending  Anesthesia: Epidural  Episiotomy: None Lacerations: 1st degree Suture Repair: 3.0 vicryl Est. Blood Loss (mL): 100 mL  Mom to postpartum. Baby to Nursery.  STINSON, JACOB JEHIEL 07/15/2015, 10:30 AM  Postpartum, the patient did well with no complications.  On the day of discharge,  Pt denied problems with ambulating, voiding or po intake.  She denied nausea or vomiting.  Pain wass well controlled.  She has had flatus. She has had bowel movement.  Lochia Small.  Plan for birth control is undecided.    H/H: Lab Results  Component Value Date/Time   HGB 11.9* 07/16/2015 05:15 AM   HGB 13.0 12/10/2014   HCT 35.5* 07/16/2015 05:15 AM   HCT 39 12/10/2014    Filed Vitals:   07/16/15 1751 07/17/15 0700  BP: 122/74 116/64  Pulse: 82 66  Temp: 98.5 F (36.9 C) 98.9 F (37.2 C)  Resp: 20 18    Physical Exam: VSS NAD Abd: Appropriately tender, ND, Fundus firm No c/c/e, Neg homan's sign, neg cords Lochia Appropriate  Discharge Diagnoses: Term Pregnancy-delivered  Discharge Information: Date: 07/17/2015 Activity: pelvic rest Diet: routine  Medications: Ibuprofen Breast feeding:  Yes Condition: stable Instructions: refer to handout Discharge to: home       Discharge Instructions    Diet - low sodium heart healthy    Complete by:  As directed             Medication List    TAKE these  medications        oxyCODONE-acetaminophen 7.5-325 MG tablet  Commonly known as:  PERCOCET  Take 1 tablet by mouth every 8 (eight) hours as needed for severe pain.     PRENATAL VITAMIN PO  Take by mouth.       Follow-up Information    [facility] In [facility] weeks.   Specialty:  Obstetrics and Gynecology   Contact information:   [address] [place] [phone]      Jacquiline Doealeb Parker 07/17/2015,7:35 AM     OB fellow attestation I have seen and examined this patient and agree with above documentation in the resident's note.   Siri ColeDeandrea Kovacic is a 30 y.o. [id number] s/p NSVD.   Pain is well controlled.  Plan for birth control is no method.  Method of Feeding: Breast  PE:  BP 116/64 mmHg  Pulse 66  Temp(Src) 98.9 F (37.2 C) (Oral)  Resp 18  Ht 5\' 9"  (1.753 m)  Wt 183 lb (83.008 kg)  BMI 27.01 kg/m2  SpO2 100%  LMP 09/15/2014  Breastfeeding? Unknown Gen: well appearing Heart: reg rate Lungs: normal WOB Fundus firm Ext: soft, no pain, no edema   Recent Labs  07/15/15 0905 07/16/15 0515  HGB 13.1 11.9*  HCT 38.3 35.5*   Plan: discharge today - postpartum care discussed - f/u clinic in 6 weeks for postpartum visit  Federico FlakeKimberly Niles Newton, MD 9:09 AM

## 2015-07-17 NOTE — Lactation Note (Addendum)
This note was copied from the chart of Tiffany Eureka Valdes. Lactation Consultation Note  Baby has had a lot of short feedings.  Discussed waking techniques and breast massage to keep him active. Mother unwrapped baby and latched in cradle hold.  Repositioned baby to cross cradle for increased depth. Sucks and swallows observed.  Encouraged feeding him for longer than 10 min on both breasts, waking if needed. Reviewed engorgement care and monitoring voids/stools. Provided mother w/ manual pump.  Mother requested DEBP kit from [employer].  Charged mother for pump kit.  Patient Name: Tiffany Wilkinson [id number] Date: 07/17/2015 Reason for consult: Follow-up assessment   Maternal Data    Feeding Feeding Type: Breast Fed Length of feed: 5 min  LATCH Score/Interventions Latch: Grasps breast easily, tongue down, lips flanged, rhythmical sucking.  Audible Swallowing: Spontaneous and intermittent  Type of Nipple: Everted at rest and after stimulation  Comfort (Breast/Nipple): Filling, red/small blisters or bruises, mild/mod discomfort  Problem noted: Mild/Moderate discomfort  Hold (Positioning): Assistance needed to correctly position infant at breast and maintain latch.  LATCH Score: 8  Lactation Tools Discussed/Used     Consult Status Consult Status: Complete    Carlye Grippe 07/17/2015, 10:03 AM

## 2015-07-30 ENCOUNTER — Telehealth: Payer: Self-pay | Admitting: *Deleted

## 2015-07-30 NOTE — Telephone Encounter (Signed)
Tiffany Wilkinson called this am about a form that she needs [place] to sign stating she is on maternity leave so she can get assistance with child care. If not done by Friday , they will terminate.  Per chart review verified she is on maternity leave. Called her and instructed her to bring form asap- we usually allow 7-10 business days but we will do our best to fill it out asap.

## 2015-08-12 ENCOUNTER — Encounter: Payer: Self-pay | Admitting: *Deleted

## 2015-08-12 ENCOUNTER — Telehealth: Payer: Self-pay | Admitting: *Deleted

## 2015-08-12 NOTE — Telephone Encounter (Signed)
Dr Adrian Blackwater ok'd patient to return to work. Called patient and let her know she can pick up her letter at the front desk. Understanding voiced.

## 2015-08-12 NOTE — Telephone Encounter (Signed)
Patient called requesting a letter to return to work. She is 4 weeks postpartum and is scheduled for a postpartum appt on 08/26/15. Stated she needs to return to work asap. Please call.

## 2015-08-26 ENCOUNTER — Ambulatory Visit: Payer: Medicaid Other | Admitting: Family Medicine

## 2015-11-01 ENCOUNTER — Ambulatory Visit (INDEPENDENT_AMBULATORY_CARE_PROVIDER_SITE_OTHER): Payer: Medicaid Other | Admitting: Family Medicine

## 2015-11-01 ENCOUNTER — Encounter: Payer: Self-pay | Admitting: Family Medicine

## 2015-11-01 MED ORDER — NORGESTIMATE-ETH ESTRADIOL 0.25-35 MG-MCG PO TABS: 1.0000 | ORAL_TABLET | Freq: Every day | ORAL | Status: DC

## 2015-11-01 NOTE — Progress Notes (Signed)
Patient ID: Tiffany Wilkinson Yeakel, female   DOB: 23-Feb-1986, 30 y.o.   MRN: [id number] Subjective:     Tiffany Wilkinson Iwai is a [age] y.o. female who presents for a postpartum visit. She is delivered on 07/15/2015. I have fully reviewed the prenatal and intrapartum course. The delivery was at [redacted] weeks gestational weeks. Outcome: vaginal delivery . Anesthesia: Epidural. Baby feeding by bottle. Bowel function is normal. Patient is sexually active. Contraception method is birth control. Postpartum depression screening: negative    The following portions of the patient's history were reviewed and updated as appropriate: allergies, current medications, past family history, past medical history, past social history, past surgical history and problem list.  Review of Systems Pertinent items are noted in HPI.   Objective:    BP 132/81 mmHg  Pulse 83  Wt 162 lb 3.2 oz (73.573 kg)  General:  alert, cooperative and no distress  Lungs: clear to auscultation bilaterally  Heart:  regular rate and rhythm, S1, S2 normal, no murmur, click, rub or gallop  Abdomen: soft, non-tender; bowel sounds normal; no masses,  no organomegaly        Assessment:     Normal postpartum exam. Pap smear not done at today's visit.   Plan:    1. Contraception: OCP (estrogen/progesterone) 2. Follow up in: 1 year or as needed.

## 2015-11-09 ENCOUNTER — Other Ambulatory Visit: Payer: Self-pay | Admitting: Medical

## 2016-02-08 ENCOUNTER — Emergency Department (HOSPITAL_BASED_OUTPATIENT_CLINIC_OR_DEPARTMENT_OTHER)
Admission: EM | Admit: 2016-02-08 | Discharge: 2016-02-08 | Disposition: A | Discharge status: Home / Self Care | Payer: Medicaid Other | Attending: Emergency Medicine | Admitting: Emergency Medicine

## 2016-02-08 ENCOUNTER — Encounter (HOSPITAL_BASED_OUTPATIENT_CLINIC_OR_DEPARTMENT_OTHER): Payer: Self-pay | Admitting: Emergency Medicine

## 2016-02-08 DIAGNOSIS — N760 Acute vaginitis: Secondary | ICD-10-CM | POA: Insufficient documentation

## 2016-02-08 DIAGNOSIS — Z87891 Personal history of nicotine dependence: Secondary | ICD-10-CM | POA: Insufficient documentation

## 2016-02-08 DIAGNOSIS — B9689 Other specified bacterial agents as the cause of diseases classified elsewhere: Secondary | ICD-10-CM

## 2016-02-08 DIAGNOSIS — N898 Other specified noninflammatory disorders of vagina: Secondary | ICD-10-CM | POA: Diagnosis present

## 2016-02-08 LAB — WET PREP, GENITAL
CLUE CELLS WET PREP: NONE SEEN
SPERM: NONE SEEN
Trich, Wet Prep: NONE SEEN
Yeast Wet Prep HPF POC: NONE SEEN

## 2016-02-08 MED ORDER — LIDOCAINE-EPINEPHRINE 1 %-1:100000 IJ SOLN: 10.0000 mL | Freq: Once | INTRAMUSCULAR | Status: DC

## 2016-02-08 MED ORDER — METRONIDAZOLE 500 MG PO TABS: 500.0000 mg | ORAL_TABLET | Freq: Two times a day (BID) | ORAL | 1 refills | Status: DC

## 2016-02-08 NOTE — ED Provider Notes (Signed)
[facility]   CSN: [id number] Arrival date & time: 02/08/16  0255  First Provider Contact:  None       History   Chief Complaint Chief Complaint  Patient presents with  . Vaginal Discharge    HPI Tiffany Wilkinson is a 30 y.o. female Past medical history of frequent BV infections here with a recurrence. Patient states that she has had these symptoms of vaginal irritation and itching as well as foul odor for the past couple days. She states this is consistent with her normal BV infection.  She denies any vaginal bleeding, dysuria, hematuria, or abdominal pain. There are no further complaints.  10 Systems reviewed and are negative for acute change except as noted in the HPI.    HPI  Past Medical History:  Diagnosis Date  . Bacterial vaginosis   . Chlamydia   . Infection    UTI X 1  . Infection 2009   GC  . Infection    YEAST OCC  . Infection    FREQUENT BV  . Infection 2001   CHLAMYDIA  . Urinary tract infection     Patient Active Problem List   Diagnosis Date Noted  . Status post vaginal delivery 07/15/2015  . Opioid dependence, daily use (HCC) 05/06/2015  . Chronic foot pain 04/27/2015    Past Surgical History:  Procedure Laterality Date  . INDUCED ABORTION      OB History    Gravida Para Term Preterm AB Living   0 1 3   SAB TAB Ectopic Multiple Live Births   1 0 0 0 3       Home Medications    Prior to Admission medications   Medication Sig Start Date End Date Taking? Authorizing Provider  norgestimate-ethinyl estradiol (SPRINTEC 28) 0.25-35 MG-MCG tablet Take 1 tablet by mouth daily. 11/01/15   Levie Heritage, DO  oxyCODONE-acetaminophen (PERCOCET) 7.5-325 MG tablet Take 1 tablet by mouth every 8 (eight) hours as needed for severe pain. 05/06/15   Adam Phenix, MD  Prenatal Vit-Fe Fumarate-FA (PRENATAL VITAMIN PO) Take by mouth.    Historical Provider, MD    Family History Family History  Problem Relation Age  of Onset  . Anesthesia problems Neg Hx   . Alcohol abuse Mother   . Asthma Father   . Asthma Paternal Aunt   . Other Maternal Grandmother     STOMACH ULCER  . Mitral valve prolapse Maternal Grandmother   . Cancer Paternal Grandfather     PROSTATE  . Diabetes Paternal Grandfather     Social History Social History  Substance Use Topics  . Smoking status: Former Smoker    Types: Cigarettes    Quit date: 01/13/2011  . Smokeless tobacco: Never Used     Comment: 2008  . Alcohol use Yes     Comment: none with pregnancy     Allergies   Review of patient's allergies indicates no known allergies.   Review of Systems Review of Systems   Physical Exam Updated Vital Signs BP 131/99 (BP Location: Right Arm)   Pulse 65   Temp 98.5 F (36.9 C)   Resp 18   LMP 02/03/2016   SpO2 100%   Physical Exam  Constitutional: She is oriented to person, place, and time. She appears well-developed and well-nourished. No distress.  HENT:  Head: Normocephalic and atraumatic.  Nose: Nose normal.  Mouth/Throat: Oropharynx is clear and moist. No oropharyngeal exudate.  Eyes:  Conjunctivae and EOM are normal. Pupils are equal, round, and reactive to light. No scleral icterus.  Neck: Normal range of motion. Neck supple. No JVD present. No tracheal deviation present. No thyromegaly present.  Cardiovascular: Normal rate, regular rhythm and normal heart sounds.  Exam reveals no gallop and no friction rub.   No murmur heard. Pulmonary/Chest: Effort normal and breath sounds normal. No respiratory distress. She has no wheezes. She exhibits no tenderness.  Abdominal: Soft. Bowel sounds are normal. She exhibits no distension and no mass. There is no tenderness. There is no rebound and no guarding.  Genitourinary: Vagina normal and uterus normal.  Genitourinary Comments: Physiologic vaginal discharge seen. No CMT or adnexal tenderness.  Musculoskeletal: Normal range of motion. She exhibits no edema or  tenderness.  Lymphadenopathy:    She has no cervical adenopathy.  Neurological: She is alert and oriented to person, place, and time. No cranial nerve deficit. She exhibits normal muscle tone.  Skin: Skin is warm and dry. No rash noted. No erythema. No pallor.  Nursing note and vitals reviewed.    ED Treatments / Results  Labs (all labs ordered are listed, but only abnormal results are displayed) Labs Reviewed  WET PREP, GENITAL - Abnormal; Notable for the following:       Result Value   WBC, Wet Prep HPF POC MODERATE (*)    All other components within normal limits  HIV ANTIBODY (ROUTINE TESTING)  GC/CHLAMYDIA PROBE AMP ([place]) NOT AT [facility]    EKG  EKG Interpretation None       Radiology No results found.  Procedures Procedures (including critical care time)  Medications Ordered in ED Medications - No data to display   Initial Impression / Assessment and Plan / ED Course  I have reviewed the triage vital signs and the nursing notes.  Pertinent labs & imaging results that were available during my care of the patient were reviewed by me and considered in my medical decision making (see chart for details).  Clinical Course    Patient presents to the emergency department for BV. She states the tampon was in and sometimes that causes her results to be negative. Her wet prep today is negative. Based on her history and her symptoms, we'll give her prescription for Flagyl to take. OB/GYN follow-up advised. She appears well in no acute distress, vital signs were within her normal limits and she is safe for discharge.  Final Clinical Impressions(s) / ED Diagnoses   Final diagnoses:  None    New Prescriptions New Prescriptions   No medications on file     Tomasita Crumble, MD 02/08/16 [phone]

## 2016-02-08 NOTE — ED Triage Notes (Signed)
Pt reports hx of bacterial vaginosis and takes flagyl as needed and states currently having same symptoms

## 2016-02-10 LAB — GC/CHLAMYDIA PROBE AMP (~~LOC~~) NOT AT ARMC
CHLAMYDIA, DNA PROBE: NEGATIVE
NEISSERIA GONORRHEA: NEGATIVE

## 2016-05-06 ENCOUNTER — Emergency Department (HOSPITAL_BASED_OUTPATIENT_CLINIC_OR_DEPARTMENT_OTHER)
Admission: EM | Admit: 2016-05-06 | Discharge: 2016-05-06 | Disposition: A | Discharge status: Home / Self Care | Payer: Medicaid Other | Attending: Emergency Medicine | Admitting: Emergency Medicine

## 2016-05-06 ENCOUNTER — Encounter (HOSPITAL_BASED_OUTPATIENT_CLINIC_OR_DEPARTMENT_OTHER): Payer: Self-pay | Admitting: *Deleted

## 2016-05-06 DIAGNOSIS — N760 Acute vaginitis: Secondary | ICD-10-CM | POA: Diagnosis not present

## 2016-05-06 DIAGNOSIS — N899 Noninflammatory disorder of vagina, unspecified: Secondary | ICD-10-CM | POA: Diagnosis present

## 2016-05-06 DIAGNOSIS — B9689 Other specified bacterial agents as the cause of diseases classified elsewhere: Secondary | ICD-10-CM

## 2016-05-06 DIAGNOSIS — Z87891 Personal history of nicotine dependence: Secondary | ICD-10-CM | POA: Insufficient documentation

## 2016-05-06 LAB — URINALYSIS, ROUTINE W REFLEX MICROSCOPIC
BILIRUBIN URINE: NEGATIVE
GLUCOSE, UA: NEGATIVE mg/dL
HGB URINE DIPSTICK: NEGATIVE
KETONES UR: NEGATIVE mg/dL
Leukocytes, UA: NEGATIVE
Nitrite: NEGATIVE
PH: 6 (ref 5.0–8.0)
Protein, ur: NEGATIVE mg/dL
SPECIFIC GRAVITY, URINE: 1.028 (ref 1.005–1.030)

## 2016-05-06 LAB — PREGNANCY, URINE: Preg Test, Ur: NEGATIVE

## 2016-05-06 LAB — WET PREP, GENITAL
SPERM: NONE SEEN
Trich, Wet Prep: NONE SEEN
Yeast Wet Prep HPF POC: NONE SEEN

## 2016-05-06 MED ORDER — METRONIDAZOLE 500 MG PO TABS: 500.0000 mg | ORAL_TABLET | Freq: Two times a day (BID) | ORAL | 0 refills | Status: DC

## 2016-05-06 NOTE — ED Provider Notes (Signed)
[facility]   CSN: [id number] Arrival date & time: 05/06/16  1314     History   Chief Complaint Chief Complaint  Patient presents with  . Vaginal Discharge    HPI Tiffany Wilkinson is a 30 y.o. female.  The history is provided by the patient. No language interpreter was used.  Vaginal Discharge     Tiffany Wilkinson is a 30 y.o. female who presents to the Emergency Department complaining of vaginal discharge.  She reports 2-3 days of vaginal discharge with foul-smelling odor. She denies any fevers, nausea, vomiting, abdominal pain, dysuria. She has a history of bacterial vaginosis and this is similar. No new sexual partners. Symptoms are mild and constant in nature. Past Medical History:  Diagnosis Date  . Bacterial vaginosis   . Chlamydia   . Infection    UTI X 1  . Infection 2009   GC  . Infection    YEAST OCC  . Infection    FREQUENT BV  . Infection 2001   CHLAMYDIA  . Urinary tract infection     Patient Active Problem List   Diagnosis Date Noted  . Status post vaginal delivery 07/15/2015  . Opioid dependence, daily use (HCC) 05/06/2015  . Chronic foot pain 04/27/2015    Past Surgical History:  Procedure Laterality Date  . INDUCED ABORTION      OB History    Gravida Para Term Preterm AB Living   4 3 3  0 1 3   SAB TAB Ectopic Multiple Live Births   1 0 0 0 3       Home Medications    Prior to Admission medications   Medication Sig Start Date End Date Taking? Authorizing Provider  metroNIDAZOLE (FLAGYL) 500 MG tablet Take 1 tablet (500 mg total) by mouth 2 (two) times daily. 05/06/16   Tiffany FossaElizabeth Sarit Sparano, MD  Prenatal Vit-Fe Fumarate-FA (PRENATAL VITAMIN PO) Take by mouth.    Historical Provider, MD    Family History Family History  Problem Relation Age of Onset  . Alcohol abuse Mother   . Asthma Father   . Asthma Paternal Aunt   . Other Maternal Grandmother     STOMACH ULCER  . Mitral valve prolapse Maternal Grandmother    . Cancer Paternal Grandfather     PROSTATE  . Diabetes Paternal Grandfather   . Anesthesia problems Neg Hx     Social History Social History  Substance Use Topics  . Smoking status: Former Smoker    Types: Cigarettes    Quit date: 01/13/2011  . Smokeless tobacco: Never Used     Comment: 2008  . Alcohol use Yes     Comment: none with pregnancy     Allergies   Review of patient's allergies indicates no known allergies.   Review of Systems Review of Systems  Genitourinary: Positive for vaginal discharge.  All other systems reviewed and are negative.    Physical Exam Updated Vital Signs BP 140/90 (BP Location: Right Arm)   Pulse 82   Temp 98.7 F (37.1 C) (Oral)   Resp 18   Ht 5\' 9"  (1.753 m)   Wt 165 lb (74.8 kg)   LMP 04/22/2016   SpO2 100%   BMI 24.37 kg/m   Physical Exam  Constitutional: She is oriented to person, place, and time. She appears well-developed and well-nourished.  HENT:  Head: Normocephalic and atraumatic.  Cardiovascular: Normal rate and regular rhythm.   Pulmonary/Chest: Effort normal. No respiratory distress.  Abdominal:  Soft. There is no tenderness. There is no rebound and no guarding.  Genitourinary:  Genitourinary Comments: Moderate white/yellow vaginal discharge.  No CMT or adnexal tenderness, os closed.   Musculoskeletal: She exhibits no edema or tenderness.  Neurological: She is alert and oriented to person, place, and time.  Skin: Skin is warm and dry.  Psychiatric: She has a normal mood and affect. Her behavior is normal.  Nursing note and vitals reviewed.    ED Treatments / Results  Labs (all labs ordered are listed, but only abnormal results are displayed) Labs Reviewed  WET PREP, GENITAL - Abnormal; Notable for the following:       Result Value   Clue Cells Wet Prep HPF POC PRESENT (*)    WBC, Wet Prep HPF POC MANY (*)    All other components within normal limits  PREGNANCY, URINE  URINALYSIS, ROUTINE W REFLEX  MICROSCOPIC (NOT AT [facility])  GC/CHLAMYDIA PROBE AMP () NOT AT [facility]    EKG  EKG Interpretation None       Radiology No results found.  Procedures Procedures (including critical care time)  Medications Ordered in ED Medications - No data to display   Initial Impression / Assessment and Plan / ED Course  I have reviewed the triage vital signs and the nursing notes.  Pertinent labs & imaging results that were available during my care of the patient were reviewed by me and considered in my medical decision making (see chart for details).  Clinical Course    Patient with history of DVT here with recurrent vaginal discharge. She has discharge on examination with no tenderness or evidence of cervicitis or PID. Treating with Flagyl with outpatient follow-up and return precautions.  Final Clinical Impressions(s) / ED Diagnoses   Final diagnoses:  BV (bacterial vaginosis)    New Prescriptions Discharge Medication List as of 05/06/2016  3:10 PM    START taking these medications   Details  metroNIDAZOLE (FLAGYL) 500 MG tablet Take 1 tablet (500 mg total) by mouth 2 (two) times daily., Starting Wed 05/06/2016, Print         Tiffany FossaElizabeth Almyra Birman, MD 05/06/16 1538

## 2016-05-06 NOTE — ED Triage Notes (Signed)
Pt c/o vaginal discharge x 3 days 

## 2016-05-07 LAB — GC/CHLAMYDIA PROBE AMP (~~LOC~~) NOT AT ARMC
Chlamydia: NEGATIVE
Neisseria Gonorrhea: NEGATIVE

## 2016-06-12 ENCOUNTER — Emergency Department (HOSPITAL_BASED_OUTPATIENT_CLINIC_OR_DEPARTMENT_OTHER)
Admission: EM | Admit: 2016-06-12 | Discharge: 2016-06-12 | Disposition: A | Discharge status: Home / Self Care | Payer: Medicaid Other | Attending: Emergency Medicine | Admitting: Emergency Medicine

## 2016-06-12 ENCOUNTER — Encounter (HOSPITAL_BASED_OUTPATIENT_CLINIC_OR_DEPARTMENT_OTHER): Payer: Self-pay

## 2016-06-12 DIAGNOSIS — B9689 Other specified bacterial agents as the cause of diseases classified elsewhere: Secondary | ICD-10-CM

## 2016-06-12 DIAGNOSIS — Z711 Person with feared health complaint in whom no diagnosis is made: Secondary | ICD-10-CM

## 2016-06-12 DIAGNOSIS — N760 Acute vaginitis: Secondary | ICD-10-CM | POA: Insufficient documentation

## 2016-06-12 DIAGNOSIS — Z87891 Personal history of nicotine dependence: Secondary | ICD-10-CM | POA: Diagnosis not present

## 2016-06-12 DIAGNOSIS — N898 Other specified noninflammatory disorders of vagina: Secondary | ICD-10-CM | POA: Diagnosis present

## 2016-06-12 DIAGNOSIS — Z202 Contact with and (suspected) exposure to infections with a predominantly sexual mode of transmission: Secondary | ICD-10-CM | POA: Diagnosis not present

## 2016-06-12 LAB — WET PREP, GENITAL
SPERM: NONE SEEN
Trich, Wet Prep: NONE SEEN
YEAST WET PREP: NONE SEEN

## 2016-06-12 LAB — URINALYSIS, ROUTINE W REFLEX MICROSCOPIC
BILIRUBIN URINE: NEGATIVE
GLUCOSE, UA: NEGATIVE mg/dL
HGB URINE DIPSTICK: NEGATIVE
KETONES UR: NEGATIVE mg/dL
Leukocytes, UA: NEGATIVE
Nitrite: NEGATIVE
PROTEIN: NEGATIVE mg/dL
Specific Gravity, Urine: 1.029 (ref 1.005–1.030)
pH: 6 (ref 5.0–8.0)

## 2016-06-12 LAB — PREGNANCY, URINE: PREG TEST UR: NEGATIVE

## 2016-06-12 MED ORDER — CEFTRIAXONE SODIUM 250 MG IJ SOLR
250.0000 mg | Freq: Once | INTRAMUSCULAR | Status: AC
  Administered 2016-06-12: 250 mg via INTRAMUSCULAR
  Filled 2016-06-12: qty 250

## 2016-06-12 MED ORDER — METRONIDAZOLE 500 MG PO TABS: 500.0000 mg | ORAL_TABLET | Freq: Two times a day (BID) | ORAL | 0 refills | Status: AC

## 2016-06-12 MED ORDER — AZITHROMYCIN 250 MG PO TABS
1000.0000 mg | ORAL_TABLET | Freq: Once | ORAL | Status: AC
  Administered 2016-06-12: 1000 mg via ORAL
  Filled 2016-06-12: qty 4

## 2016-06-12 NOTE — ED Provider Notes (Addendum)
[facility] Provider Note   CSN: [id number] Arrival date & time: 06/12/16  1818     History   Chief Complaint Chief Complaint  Patient presents with  . Vaginal Discharge    HPI Tiffany Wilkinson is a 30 y.o. female with no past medical history presenting to emergency department with 3 days of yellow abundant vaginal discharge. She states that it typically is bacterial vaginosis but she has a new sexual partner and the condom ruptured during sexual intercourse. She denies any pain, fever, chills, vaginal bleeding, dysuria, or hematuria.  HPI  Past Medical History:  Diagnosis Date  . Bacterial vaginosis   . Chlamydia   . Infection    UTI X 1  . Infection 2009   GC  . Infection    YEAST OCC  . Infection    FREQUENT BV  . Infection 2001   CHLAMYDIA  . Urinary tract infection     Patient Active Problem List   Diagnosis Date Noted  . Status post vaginal delivery 07/15/2015  . Opioid dependence, daily use (HCC) 05/06/2015  . Chronic foot pain 04/27/2015    Past Surgical History:  Procedure Laterality Date  . INDUCED ABORTION      OB History    Gravida Para Term Preterm AB Living   4 3 3  0 1 3   SAB TAB Ectopic Multiple Live Births   1 0 0 0 3       Home Medications    Prior to Admission medications   Medication Sig Start Date End Date Taking? Authorizing Provider  metroNIDAZOLE (FLAGYL) 500 MG tablet Take 1 tablet (500 mg total) by mouth 2 (two) times daily. 06/12/16 06/19/16  Georgiana Shore, PA-C    Family History Family History  Problem Relation Age of Onset  . Alcohol abuse Mother   . Asthma Father   . Asthma Paternal Aunt   . Other Maternal Grandmother     STOMACH ULCER  . Mitral valve prolapse Maternal Grandmother   . Cancer Paternal Grandfather     PROSTATE  . Diabetes Paternal Grandfather   . Anesthesia problems Neg Hx     Social History Social History  Substance Use Topics  . Smoking status: Former Smoker    Types:  Cigarettes    Quit date: 01/13/2011  . Smokeless tobacco: Never Used     Comment: 2008  . Alcohol use Yes     Comment: occ     Allergies   Patient has no known allergies.   Review of Systems Review of Systems  Constitutional: Negative for chills, fatigue and fever.  HENT: Negative.   Eyes: Negative for pain and visual disturbance.  Respiratory: Negative for cough, choking, chest tightness, shortness of breath, wheezing and stridor.   Cardiovascular: Negative for chest pain, palpitations and leg swelling.  Gastrointestinal: Negative for abdominal distention, abdominal pain, blood in stool, diarrhea, nausea and vomiting.  Endocrine: Negative for polyuria.  Genitourinary: Positive for vaginal discharge. Negative for decreased urine volume, difficulty urinating, dyspareunia, dysuria, flank pain, frequency, hematuria, pelvic pain, urgency, vaginal bleeding and vaginal pain.       Reports having been inconsistent with her oral contraceptives recently. LMP was about two weeks ago but lasted only 2 days instead of 5 days  Musculoskeletal: Negative for back pain, neck pain and neck stiffness.  Skin: Negative for color change, pallor, rash and wound.  Neurological: Negative for dizziness, seizures, syncope and weakness.  All other systems reviewed and are negative.  Physical Exam Updated Vital Signs BP 130/83 (BP Location: Left Arm)   Pulse 80   Temp 98.6 F (37 C) (Oral)   Resp 18   Ht 5\' 9"  (1.753 m)   Wt 74.8 kg   LMP 05/29/2016   SpO2 100%   BMI 24.37 kg/m   Physical Exam  Constitutional: She is oriented to person, place, and time. She appears well-developed and well-nourished. No distress.  Patient is non-toxic appearing, lying comfortably in bed, no acute distress  HENT:  Head: Normocephalic and atraumatic.  Eyes: Conjunctivae and EOM are normal. Right eye exhibits no discharge. Left eye exhibits no discharge. No scleral icterus.  Neck: Normal range of motion. Neck  supple.  Cardiovascular: Normal rate, regular rhythm and normal heart sounds.   No murmur heard. Pulmonary/Chest: Effort normal and breath sounds normal. No stridor. No respiratory distress. She has no wheezes. She has no rales. She exhibits no tenderness.  Abdominal: Soft. Bowel sounds are normal. She exhibits no distension and no mass. There is no tenderness. There is no rebound and no guarding. No hernia.  Genitourinary: Vaginal discharge found.  Genitourinary Comments: Yellow purulent discharge from cervix. Cervix friable and noted a 0.5 x 0.5 cm area of erythematous differentiated tissue.  Musculoskeletal: She exhibits no edema or tenderness.  Neurological: She is alert and oriented to person, place, and time.  Skin: Skin is warm and dry. No rash noted. She is not diaphoretic. No erythema. No pallor.  Psychiatric: She has a normal mood and affect. Her behavior is normal.  Nursing note and vitals reviewed.    ED Treatments / Results  Labs (all labs ordered are listed, but only abnormal results are displayed) Labs Reviewed  WET PREP, GENITAL - Abnormal; Notable for the following:       Result Value   Clue Cells Wet Prep HPF POC PRESENT (*)    WBC, Wet Prep HPF POC MANY (*)    All other components within normal limits  PREGNANCY, URINE  URINALYSIS, ROUTINE W REFLEX MICROSCOPIC  RPR  HIV ANTIBODY (ROUTINE TESTING)  GC/CHLAMYDIA PROBE AMP ([place]    EKG  EKG Interpretation None       Radiology No results found.  Procedures Procedures (including critical care time)  Medications Ordered in ED Medications  cefTRIAXone (ROCEPHIN) injection 250 mg (250 mg Intramuscular Given 06/12/16 1936)  azithromycin (ZITHROMAX) tablet 1,000 mg (1,000 mg Oral Given 06/12/16 1935)     Initial Impression / Assessment and Plan / ED Course  I have reviewed the triage vital signs and the nursing notes.  Pertinent labs & imaging results that were available during my care  of the patient were reviewed by me and considered in my medical decision making (see chart for details).  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 New sexual partner, unprotected intercourse and now 3 days of yellow discharge. She denies pelvic pain or any other symptoms.   Thick yellow purulent cervical discharge. No adnexal tenderness or masses appreciated. Cervix was friable and an area of differentiated tissue of unknown etiology was visualised. Strongly advised to follow up with OB/GYN for further workup. Last pap: patient unsure. She states that she has seen her OB/GYN 10 months ago when she had her baby.  Discharge home with metronidazole. Patient advised to avoid alcohol while on this medication. Patient was agreeable to discharge plan.  Patient to be discharged with instructions to  follow up with OBGYN. Discussed importance of using protection when sexually active. Pt understands that they have GC/Chlamydia cultures pending and that they will need to inform all sexual partners if results return positive. Pt has been treated prophylacticly with azithromycin and rocephin due to pts history, pelvic exam, and wet prep with increased WBCs. Pt not concerning for PID because hemodynamically stable and no cervical motion tenderness on pelvic exam. Pt has also been treated with flagyl for Bacterial Vaginosis. Pt has been advised to not drink alcohol while on this medication.   Final Clinical Impressions(s) / ED Diagnoses   Final diagnoses:  Concern about STD in female without diagnosis  Bacterial vaginosis    New Prescriptions New Prescriptions   METRONIDAZOLE (FLAGYL) 500 MG TABLET    Take 1 tablet (500 mg total) by mouth 2 (two) times daily.     Georgiana ShoreJessica B Canyon Lohr, PA-C 06/12/16 1951    Melene Planan Floyd, DO 06/12/16 2109    Georgiana ShoreJessica B Ifeanyi Mickelson, PA-C 06/12/16 2145    Melene Planan Floyd, DO 06/13/16 1640

## 2016-06-12 NOTE — ED Triage Notes (Signed)
Vaginal d/c x 3 days-NAD-steady gait

## 2016-06-13 LAB — HIV ANTIBODY (ROUTINE TESTING W REFLEX): HIV SCREEN 4TH GENERATION: NONREACTIVE

## 2016-06-13 LAB — RPR: RPR: NONREACTIVE

## 2016-06-15 LAB — GC/CHLAMYDIA PROBE AMP (~~LOC~~) NOT AT ARMC
Chlamydia: NEGATIVE
NEISSERIA GONORRHEA: NEGATIVE

## 2020-02-10 ENCOUNTER — Encounter (HOSPITAL_COMMUNITY): Payer: Self-pay | Admitting: Obstetrics & Gynecology

## 2020-02-10 ENCOUNTER — Inpatient Hospital Stay (HOSPITAL_COMMUNITY)
Admission: AD | Admit: 2020-02-10 | Discharge: 2020-02-10 | Disposition: A | Discharge status: Home / Self Care | Payer: Medicaid Other | Attending: Obstetrics & Gynecology | Admitting: Obstetrics & Gynecology

## 2020-02-10 ENCOUNTER — Other Ambulatory Visit: Payer: Self-pay

## 2020-02-10 ENCOUNTER — Inpatient Hospital Stay (HOSPITAL_COMMUNITY): Payer: Medicaid Other

## 2020-02-10 DIAGNOSIS — O09521 Supervision of elderly multigravida, first trimester: Secondary | ICD-10-CM | POA: Insufficient documentation

## 2020-02-10 DIAGNOSIS — Z3A01 Less than 8 weeks gestation of pregnancy: Secondary | ICD-10-CM | POA: Diagnosis not present

## 2020-02-10 DIAGNOSIS — Z87440 Personal history of urinary (tract) infections: Secondary | ICD-10-CM | POA: Insufficient documentation

## 2020-02-10 DIAGNOSIS — Z3491 Encounter for supervision of normal pregnancy, unspecified, first trimester: Secondary | ICD-10-CM

## 2020-02-10 DIAGNOSIS — O209 Hemorrhage in early pregnancy, unspecified: Secondary | ICD-10-CM

## 2020-02-10 DIAGNOSIS — Z87891 Personal history of nicotine dependence: Secondary | ICD-10-CM | POA: Diagnosis not present

## 2020-02-10 DIAGNOSIS — O468X1 Other antepartum hemorrhage, first trimester: Secondary | ICD-10-CM

## 2020-02-10 DIAGNOSIS — O208 Other hemorrhage in early pregnancy: Secondary | ICD-10-CM | POA: Diagnosis present

## 2020-02-10 DIAGNOSIS — O418X10 Other specified disorders of amniotic fluid and membranes, first trimester, not applicable or unspecified: Secondary | ICD-10-CM

## 2020-02-10 LAB — URINALYSIS, ROUTINE W REFLEX MICROSCOPIC
Bacteria, UA: NONE SEEN
Bilirubin Urine: NEGATIVE
Glucose, UA: NEGATIVE mg/dL
Ketones, ur: 20 mg/dL — AB
Leukocytes,Ua: NEGATIVE
Nitrite: NEGATIVE
Protein, ur: NEGATIVE mg/dL
Specific Gravity, Urine: 1.021 (ref 1.005–1.030)
pH: 5 (ref 5.0–8.0)

## 2020-02-10 LAB — CBC
HCT: 33.4 % — ABNORMAL LOW (ref 36.0–46.0)
Hemoglobin: 11 g/dL — ABNORMAL LOW (ref 12.0–15.0)
MCH: 29.3 pg (ref 26.0–34.0)
MCHC: 32.9 g/dL (ref 30.0–36.0)
MCV: 88.8 fL (ref 80.0–100.0)
Platelets: 251 10*3/uL (ref 150–400)
RBC: 3.76 MIL/uL — ABNORMAL LOW (ref 3.87–5.11)
RDW: 11.6 % (ref 11.5–15.5)
WBC: 8 10*3/uL (ref 4.0–10.5)
nRBC: 0 % (ref 0.0–0.2)

## 2020-02-10 LAB — WET PREP, GENITAL
Clue Cells Wet Prep HPF POC: NONE SEEN
Sperm: NONE SEEN
Trich, Wet Prep: NONE SEEN
Yeast Wet Prep HPF POC: NONE SEEN

## 2020-02-10 LAB — POCT PREGNANCY, URINE: Preg Test, Ur: POSITIVE — AB

## 2020-02-10 LAB — HCG, QUANTITATIVE, PREGNANCY: hCG, Beta Chain, Quant, S: 141875 m[IU]/mL — ABNORMAL HIGH (ref <5)

## 2020-02-10 NOTE — MAU Provider Note (Signed)
Chief Complaint  Patient presents with  . Vaginal Bleeding   History of Present Illness  Tiffany Wilkinson is a 34 y.o. [id number] female at 106w4d (dated by [place]) who presented to the MAU with vaginal bleeding. She was getting her nails done when she went to use the bathroom and noticed blood on the toilet paper and some in the toilet bowl. She states that the bleeding was enough to cover the toilet paper. On arrival to the MAU patient went to use the bathroom and noticed some blood on the toilet paper when wiping. Less volume of blood than previous episode. No other vaginal discharge or leakage of fluid. No abdominal pain, pelvic pain, headache, fevers, chills, dysuria, changes in urinary frequency, or diarrhea. Has chronic history of constipation, with no acute changes. Has history of SAB in 2001.   Pertinent Gynecological History: Menses: regular Bleeding: no history of bleeding outside of menses Contraception: none DES exposure: unknown Blood transfusions: none Sexually transmitted diseases: past history: chlamydia (2013), trichomoniasis?? (01/2011)  Previous GYN Procedures: unknown  Last mammogram: unknown Last pap: normal Date: 01/2012   Past Medical History:  Diagnosis Date  . Bacterial vaginosis   . Chlamydia   . Infection    UTI X 1  . Infection 2009   GC  . Infection    YEAST OCC  . Infection    FREQUENT BV  . Infection 2001   CHLAMYDIA  . Urinary tract infection     Past Surgical History:  Procedure Laterality Date  . INDUCED ABORTION      Family History  Problem Relation Age of Onset  . Alcohol abuse Mother   . Asthma Father   . Asthma Paternal Aunt   . Other Maternal Grandmother        STOMACH ULCER  . Mitral valve prolapse Maternal Grandmother   . Cancer Paternal Grandfather        PROSTATE  . Diabetes Paternal Grandfather   . Anesthesia problems Neg Hx     Social History   Tobacco Use  . Smoking status: Former Smoker    Types: Cigarettes    Quit  date: 01/13/2011    Years since quitting: 9.0  . Smokeless tobacco: Never Used  . Tobacco comment: 2008  Substance Use Topics  . Alcohol use: Yes    Comment: occ  . Drug use: No    Allergies: No Known Allergies  No medications prior to admission.    Review of Systems  Constitutional: Negative for chills, fatigue and fever.  HENT: Negative.   Eyes: Negative.   Respiratory: Negative for chest tightness and shortness of breath.   Cardiovascular: Negative for chest pain and palpitations.  Gastrointestinal: Positive for constipation. Negative for abdominal pain, diarrhea, nausea and vomiting.  Genitourinary: Positive for vaginal bleeding. Negative for dysuria, flank pain, frequency, hematuria, pelvic pain, urgency, vaginal discharge and vaginal pain.  Skin: Negative for rash.  Neurological: Negative for dizziness, light-headedness and headaches.   Physical Exam Blood pressure 122/67, pulse 73, temperature 98.8 F (37.1 C), temperature source Oral, resp. rate 16, height 5\' 9"  (1.753 m), weight 79.5 kg, SpO2 100 %, unknown if currently breastfeeding. Physical Exam Constitutional:      General: She is awake.     Appearance: She is not ill-appearing.  HENT:     Head: Normocephalic and atraumatic.     Mouth/Throat:     Mouth: Mucous membranes are moist.  Eyes:     Extraocular Movements: Extraocular movements intact.  Pupils: Pupils are equal, round, and reactive to light.  Cardiovascular:     Rate and Rhythm: Normal rate and regular rhythm.     Heart sounds: No murmur heard.  No friction rub. No gallop.   Abdominal:     General: Bowel sounds are normal.     Palpations: Abdomen is soft.     Tenderness: There is no abdominal tenderness. There is no right CVA tenderness or left CVA tenderness.  Skin:    General: Skin is warm and dry.  Neurological:     Mental Status: She is alert.     MAU Course Procedures and Labs  Results for orders placed or performed during the  hospital encounter of 02/10/20 (from the past 24 hour(s))  Urinalysis, Routine w reflex microscopic Urine, Clean Catch     Status: Abnormal   Collection Time: 02/10/20  5:19 PM  Result Value Ref Range   Color, Urine YELLOW YELLOW   APPearance CLEAR CLEAR   Specific Gravity, Urine 1.021 1.005 - 1.030   pH 5.0 5.0 - 8.0   Glucose, UA NEGATIVE NEGATIVE mg/dL   Hgb urine dipstick SMALL (A) NEGATIVE   Bilirubin Urine NEGATIVE NEGATIVE   Ketones, ur 20 (A) NEGATIVE mg/dL   Protein, ur NEGATIVE NEGATIVE mg/dL   Nitrite NEGATIVE NEGATIVE   Leukocytes,Ua NEGATIVE NEGATIVE   RBC / HPF 0-5 0 - 5 RBC/hpf   WBC, UA 0-5 0 - 5 WBC/hpf   Bacteria, UA NONE SEEN NONE SEEN   Squamous Epithelial / LPF 0-5 0 - 5   Mucus PRESENT    Hyaline Casts, UA PRESENT   Pregnancy, urine POC     Status: Abnormal   Collection Time: 02/10/20  5:21 PM  Result Value Ref Range   Preg Test, Ur POSITIVE (A) NEGATIVE  CBC     Status: Abnormal   Collection Time: 02/10/20  5:59 PM  Result Value Ref Range   WBC 8.0 4.0 - 10.5 K/uL   RBC 3.76 (L) 3.87 - 5.11 MIL/uL   Hemoglobin 11.0 (L) 12.0 - 15.0 g/dL   HCT 98.3 (L) 36 - 46 %   MCV 88.8 80.0 - 100.0 fL   MCH 29.3 26.0 - 34.0 pg   MCHC 32.9 30.0 - 36.0 g/dL   RDW 38.2 50.5 - 39.7 %   Platelets 251 150 - 400 K/uL   nRBC 0.0 0.0 - 0.2 %   US OB Comp Less 14 Wks  Result Date: 02/10/2020 CLINICAL DATA:  Vaginal bleeding EXAM: OBSTETRIC <14 WK ULTRASOUND TECHNIQUE: Transabdominal ultrasound was performed for evaluation of the gestation as well as the maternal uterus and adnexal regions. COMPARISON:  None. FINDINGS: Intrauterine gestational sac: Single Yolk sac:  Visualized. Embryo:  Visualized. Cardiac Activity: Visualized. Heart Rate: 129 bpm MSD:    mm    w     d CRL:   7.1 mm   6 w 4 d                  [place] EDC: 10/01/2020 Subchorionic hemorrhage:  Small subchorionic hemorrhage. Maternal uterus/adnexae: No adnexal mass or free fluid. Small fibroid in posterior body measuring  1 cm. IMPRESSION: Six week 4 day intrauterine pregnancy. Fetal heart rate 129 beats per minute. Small subchorionic hemorrhage. Electronically Signed   By: Charlett Nose M.D.   On: 02/10/2020 18:51     MDM Tynetta Bachmann is a 34 y.o. [id number] female at [redacted]w[redacted]d (dated by [place]) who presented to the MAU with vaginal  bleeding. No abdominal/pelvic pain, leakage of fluid, fever, chills, dysuria, changes in urinary frequency, or diarrhea. Obtained pregnancy test which was positive. Concerning for ectopic pregnancy vs subchorionic hemorrhage. Obtained transvaginal US, CBC, UA, GC/Chlamydia, B-hCG, and wet prep. CBC, UA, and wet prep unremarkable. US demonstrated IUP with small subchorionic hemorrhage. GC/Chlamydia and B-hCG pending. Acute vaginal bleeding likely 2/2 subchorionic hemorrhage. Reassuring patient afebrile, VSS, and without abdominal/pelvic pain.  Assessment and Plan  # Small Subchorionic Hemorrhage - Will likely self-resolve. Patient discharged hemodynamically stable. - Discussed return precautions, including including increased amount and frequency of bleeding and new-onset abdominal pain.  Sol Blazing, Medical Student 02/10/2020 7:26 PM

## 2020-02-10 NOTE — MAU Note (Signed)
Tiffany Wilkinson is a 34 y.o.  here in MAU reporting: states today when she was at a pizza place she went the bathroom and saw some bleeding. States saw some in the toilet and a lot on the tissue. Today in MAU when using the bathroom saw a small clot and a little bit of bleeding on the tissue. Denies pain. No other abnormal discharge. No recent IC  LMP: unknown, states sometime in June  Onset of complaint: today  Pain score: 0/10  Vitals:   02/10/20 1724  BP: 122/67  Pulse: 73  Resp: 16  Temp: 98.8 F (37.1 C)  SpO2: 100%     Lab orders placed from triage: UA, UPT

## 2020-02-10 NOTE — MAU Provider Note (Signed)
History     CSN: [id number]  Arrival date and time: 02/10/20 1707   First Provider Initiated Contact with Patient 02/10/20 1750      Chief Complaint  Patient presents with  . Vaginal Bleeding   HPI Tiffany Wilkinson is a 34 y.o. [id number] at Unknown gestational age who presents with vaginal bleeding. She states it started this afternoon while she was getting her nails done. She states there was some blood on the tissue when she wiped and then more when she came to MAU today. She denies any pain or other vaginal discharge.   OB History    Gravida  5   Para  3   Term  3   Preterm  0   AB  1   Living  3     SAB  1   TAB  0   Ectopic  0   Multiple  0   Live Births  3           Past Medical History:  Diagnosis Date  . Bacterial vaginosis   . Chlamydia   . Infection    UTI X 1  . Infection 2009   GC  . Infection    YEAST OCC  . Infection    FREQUENT BV  . Infection 2001   CHLAMYDIA  . Urinary tract infection     Past Surgical History:  Procedure Laterality Date  . INDUCED ABORTION      Family History  Problem Relation Age of Onset  . Alcohol abuse Mother   . Asthma Father   . Asthma Paternal Aunt   . Other Maternal Grandmother        STOMACH ULCER  . Mitral valve prolapse Maternal Grandmother   . Cancer Paternal Grandfather        PROSTATE  . Diabetes Paternal Grandfather   . Anesthesia problems Neg Hx     Social History   Tobacco Use  . Smoking status: Former Smoker    Types: Cigarettes    Quit date: 01/13/2011    Years since quitting: 9.0  . Smokeless tobacco: Never Used  . Tobacco comment: 2008  Substance Use Topics  . Alcohol use: Yes    Comment: occ  . Drug use: No    Allergies: No Known Allergies  No medications prior to admission.    Review of Systems  Constitutional: Negative.  Negative for fatigue and fever.  HENT: Negative.   Respiratory: Negative.  Negative for shortness of breath.   Cardiovascular: Negative.   Negative for chest pain.  Gastrointestinal: Negative.  Negative for abdominal pain, constipation, diarrhea, nausea and vomiting.  Genitourinary: Positive for vaginal bleeding. Negative for dysuria and vaginal discharge.  Neurological: Negative.  Negative for dizziness and headaches.   Physical Exam   Blood pressure 122/67, pulse 73, temperature 98.8 F (37.1 C), temperature source Oral, resp. rate 16, height 5\' 9"  (1.753 m), weight 79.5 kg, SpO2 100 %, unknown if currently breastfeeding.  Physical Exam Vitals and nursing note reviewed.  Constitutional:      General: She is not in acute distress.    Appearance: She is well-developed.  HENT:     Head: Normocephalic.  Eyes:     Pupils: Pupils are equal, round, and reactive to light.  Cardiovascular:     Rate and Rhythm: Normal rate and regular rhythm.     Heart sounds: Normal heart sounds.  Pulmonary:     Effort: Pulmonary effort is normal. No respiratory distress.  Breath sounds: Normal breath sounds.  Abdominal:     General: Bowel sounds are normal. There is no distension.     Palpations: Abdomen is soft.     Tenderness: There is no abdominal tenderness.  Genitourinary:    Vagina: Bleeding present.  Skin:    General: Skin is warm and dry.  Neurological:     Mental Status: She is alert and oriented to person, place, and time.  Psychiatric:        Behavior: Behavior normal.        Thought Content: Thought content normal.        Judgment: Judgment normal.     MAU Course  Procedures Results for orders placed or performed during the hospital encounter of 02/10/20 (from the past 24 hour(s))  Urinalysis, Routine w reflex microscopic Urine, Clean Catch     Status: Abnormal   Collection Time: 02/10/20  5:19 PM  Result Value Ref Range   Color, Urine YELLOW YELLOW   APPearance CLEAR CLEAR   Specific Gravity, Urine 1.021 1.005 - 1.030   pH 5.0 5.0 - 8.0   Glucose, UA NEGATIVE NEGATIVE mg/dL   Hgb urine dipstick SMALL (A)  NEGATIVE   Bilirubin Urine NEGATIVE NEGATIVE   Ketones, ur 20 (A) NEGATIVE mg/dL   Protein, ur NEGATIVE NEGATIVE mg/dL   Nitrite NEGATIVE NEGATIVE   Leukocytes,Ua NEGATIVE NEGATIVE   RBC / HPF 0-5 0 - 5 RBC/hpf   WBC, UA 0-5 0 - 5 WBC/hpf   Bacteria, UA NONE SEEN NONE SEEN   Squamous Epithelial / LPF 0-5 0 - 5   Mucus PRESENT    Hyaline Casts, UA PRESENT   Pregnancy, urine POC     Status: Abnormal   Collection Time: 02/10/20  5:21 PM  Result Value Ref Range   Preg Test, Ur POSITIVE (A) NEGATIVE  CBC     Status: Abnormal   Collection Time: 02/10/20  5:59 PM  Result Value Ref Range   WBC 8.0 4.0 - 10.5 K/uL   RBC 3.76 (L) 3.87 - 5.11 MIL/uL   Hemoglobin 11.0 (L) 12.0 - 15.0 g/dL   HCT 41.6 (L) 36 - 46 %   MCV 88.8 80.0 - 100.0 fL   MCH 29.3 26.0 - 34.0 pg   MCHC 32.9 30.0 - 36.0 g/dL   RDW 60.6 30.1 - 60.1 %   Platelets 251 150 - 400 K/uL   nRBC 0.0 0.0 - 0.2 %  hCG, quantitative, pregnancy     Status: Abnormal   Collection Time: 02/10/20  5:59 PM  Result Value Ref Range   hCG, Beta Chain, Quant, S 141,875 (H) <5 mIU/mL  Wet prep, genital     Status: Abnormal   Collection Time: 02/10/20  6:10 PM   Specimen: Cervix  Result Value Ref Range   Yeast Wet Prep HPF POC NONE SEEN NONE SEEN   Trich, Wet Prep NONE SEEN NONE SEEN   Clue Cells Wet Prep HPF POC NONE SEEN NONE SEEN   WBC, Wet Prep HPF POC MANY (A) NONE SEEN   Sperm NONE SEEN    US OB Comp Less 14 Wks  Result Date: 02/10/2020 CLINICAL DATA:  Vaginal bleeding EXAM: OBSTETRIC <14 WK ULTRASOUND TECHNIQUE: Transabdominal ultrasound was performed for evaluation of the gestation as well as the maternal uterus and adnexal regions. COMPARISON:  None. FINDINGS: Intrauterine gestational sac: Single Yolk sac:  Visualized. Embryo:  Visualized. Cardiac Activity: Visualized. Heart Rate: 129 bpm MSD:    mm  w     d CRL:   7.1 mm   6 w 4 d                  [place] EDC: 10/01/2020 Subchorionic hemorrhage:  Small subchorionic hemorrhage.  Maternal uterus/adnexae: No adnexal mass or free fluid. Small fibroid in posterior body measuring 1 cm. IMPRESSION: Six week 4 day intrauterine pregnancy. Fetal heart rate 129 beats per minute. Small subchorionic hemorrhage. Electronically Signed   By: Charlett Nose M.D.   On: 02/10/2020 18:51    MDM UA, UPT CBC, HCG ABO/Rh- A Pos Wet prep and gc/chlamydia US OB Comp Less 14 weeks with Transvaginal   Assessment and Plan   1. Normal intrauterine pregnancy on prenatal ultrasound in first trimester   2. Vaginal bleeding affecting early pregnancy   3. Subchorionic hemorrhage of placenta in first trimester, single or unspecified fetus    -Discharge home in stable condition -First trimester and vaginal bleeding precautions discussed -Patient advised to follow-up with OB of choice to start prenatal care -Patient may return to MAU as needed or if her condition were to change or worsen   Rolm Bookbinder CNM 02/10/2020, 7:02 PM

## 2020-02-10 NOTE — Discharge Instructions (Signed)
Safe Medications in Pregnancy  ° °Acne: °Benzoyl Peroxide °Salicylic Acid ° °Backache/Headache: °Tylenol: 2 regular strength every 4 hours OR °             2 Extra strength every 6 hours ° °Colds/Coughs/Allergies: °Benadryl (alcohol free) 25 mg every 6 hours as needed °Breath right strips °Claritin °Cepacol throat lozenges °Chloraseptic throat spray °Cold-Eeze- up to three times per day °Cough drops, alcohol free °Flonase (by prescription only) °Guaifenesin °Mucinex °Robitussin DM (plain only, alcohol free) °Saline nasal spray/drops °Sudafed (pseudoephedrine) & Actifed ** use only after [redacted] weeks gestation and if you do not have high blood pressure °Tylenol °Vicks Vaporub °Zinc lozenges °Zyrtec  ° °Constipation: °Colace °Ducolax suppositories °Fleet enema °Glycerin suppositories °Metamucil °Milk of magnesia °Miralax °Senokot °Smooth move tea ° °Diarrhea: °Kaopectate °Imodium A-D ° °*NO pepto Bismol ° °Hemorrhoids: °Anusol °Anusol HC °Preparation H °Tucks ° °Indigestion: °Tums °Maalox °Mylanta °Zantac  °Pepcid ° °Insomnia: °Benadryl (alcohol free) 25mg every 6 hours as needed °Tylenol PM °Unisom, no Gelcaps ° °Leg Cramps: °Tums °MagGel ° °Nausea/Vomiting:  °Bonine °Dramamine °Emetrol °Ginger extract °Sea bands °Meclizine  °Nausea medication to take during pregnancy:  °Unisom (doxylamine succinate 25 mg tablets) Take one tablet daily at bedtime. If symptoms are not adequately controlled, the dose can be increased to a maximum recommended dose of two tablets daily (1/2 tablet in the morning, 1/2 tablet mid-afternoon and one at bedtime). °Vitamin B6 100mg tablets. Take one tablet twice a day (up to 200 mg per day). ° °Skin Rashes: °Aveeno products °Benadryl cream or 25mg every 6 hours as needed °Calamine Lotion °1% cortisone cream ° °Yeast infection: °Gyne-lotrimin 7 °Monistat 7 ° ° °**If taking multiple medications, please check labels to avoid duplicating the same active ingredients °**take medication as directed on  the label °** Do not exceed 4000 mg of tylenol in 24 hours °**Do not take medications that contain aspirin or ibuprofen ° ° ° ° °Subchorionic Hematoma ° °A subchorionic hematoma is a gathering of blood between the outer wall of the embryo (chorion) and the inner wall of the womb (uterus). °This condition can cause vaginal bleeding. If they cause little or no vaginal bleeding, early small hematomas usually shrink on their own and do not affect your baby or pregnancy. When bleeding starts later in pregnancy, or if the hematoma is larger or occurs in older pregnant women, the condition may be more serious. Larger hematomas may get bigger, which increases the chances of miscarriage. This condition also increases the risk of: °· Premature separation of the placenta from the uterus. °· Premature (preterm) labor. °· Stillbirth. °What are the causes? °The exact cause of this condition is not known. It occurs when blood is trapped between the placenta and the uterine wall because the placenta has separated from the original site of implantation. °What increases the risk? °You are more likely to develop this condition if: °· You were treated with fertility medicines. °· You conceived through in vitro fertilization (IVF). °What are the signs or symptoms? °Symptoms of this condition include: °· Vaginal spotting or bleeding. °· Contractions of the uterus. These cause abdominal pain. °Sometimes you may have no symptoms and the bleeding may only be seen when ultrasound images are taken (transvaginal ultrasound). °How is this diagnosed? °This condition is diagnosed based on a physical exam. This includes a pelvic exam. You may also have other tests, including: °· Blood tests. °· Urine tests. °· Ultrasound of the abdomen. °How is this treated? °Treatment for this condition   can vary. Treatment may include: °· Watchful waiting. You will be monitored closely for any changes in bleeding. During this stage: °? The hematoma may be  reabsorbed by the body. °? The hematoma may separate the fluid-filled space containing the embryo (gestational sac) from the wall of the womb (endometrium). °· Medicines. °· Activity restriction. This may be needed until the bleeding stops. °Follow these instructions at home: °· Stay on bed rest if told to do so by your health care provider. °· Do not lift anything that is heavier than 10 lbs. (4.5 kg) or as told by your health care provider. °· Do not use any products that contain nicotine or tobacco, such as cigarettes and e-cigarettes. If you need help quitting, ask your health care provider. °· Track and write down the number of pads you use each day and how soaked (saturated) they are. °· Do not use tampons. °· Keep all follow-up visits as told by your health care provider. This is important. Your health care provider may ask you to have follow-up blood tests or ultrasound tests or both. °Contact a health care provider if: °· You have any vaginal bleeding. °· You have a fever. °Get help right away if: °· You have severe cramps in your stomach, back, abdomen, or pelvis. °· You pass large clots or tissue. Save any tissue for your health care provider to look at. °· You have more vaginal bleeding, and you faint or become lightheaded or weak. °Summary °· A subchorionic hematoma is a gathering of blood between the outer wall of the placenta and the uterus. °· This condition can cause vaginal bleeding. °· Sometimes you may have no symptoms and the bleeding may only be seen when ultrasound images are taken. °· Treatment may include watchful waiting, medicines, or activity restriction. °This information is not intended to replace advice given to you by your health care provider. Make sure you discuss any questions you have with your health care provider. °Document Revised: 06/04/2017 Document Reviewed: 08/18/2016 °[employer] Patient Education © 2020 [employer] ° °

## 2020-02-12 LAB — GC/CHLAMYDIA PROBE AMP (~~LOC~~) NOT AT ARMC
Chlamydia: NEGATIVE
Comment: NEGATIVE
Comment: NORMAL
Neisseria Gonorrhea: NEGATIVE

## 2020-09-30 IMAGING — US US OB COMP LESS 14 WK
1 series · 15 of 28 positions shown · non-contrast
Comparison: None.

CLINICAL DATA: Vaginal bleeding

EXAM:
OBSTETRIC <14 WK ULTRASOUND
TECHNIQUE: Transabdominal ultrasound was performed for evaluation of the
gestation as well as the maternal uterus and adnexal regions.

[Series 1: us ob comp less 14 wk · 15 of 35 slices shown]
[im 1/35]
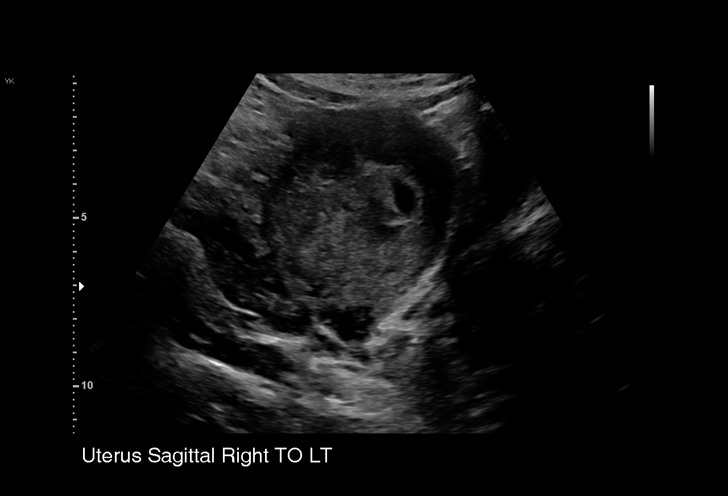
[im 3/35]
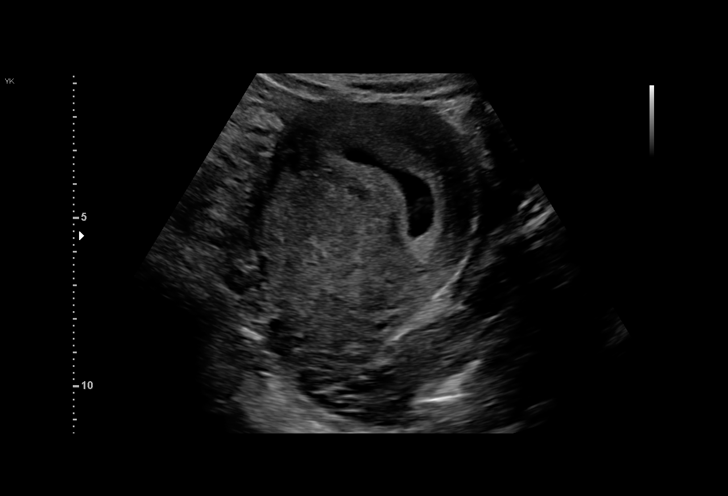
[im 6/35]
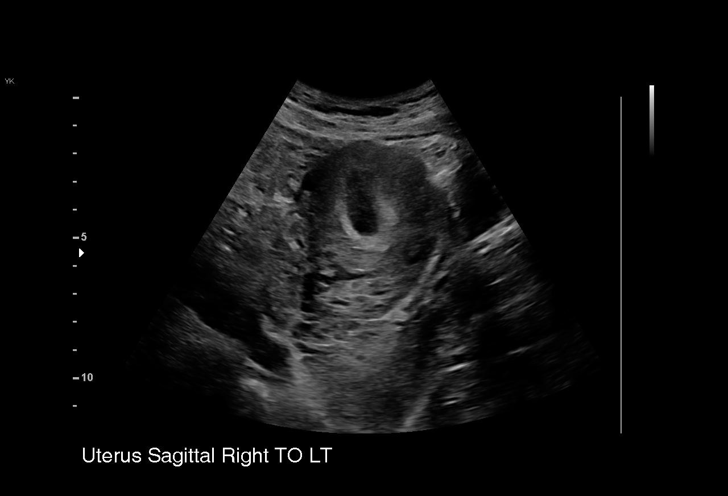
[im 8/35]
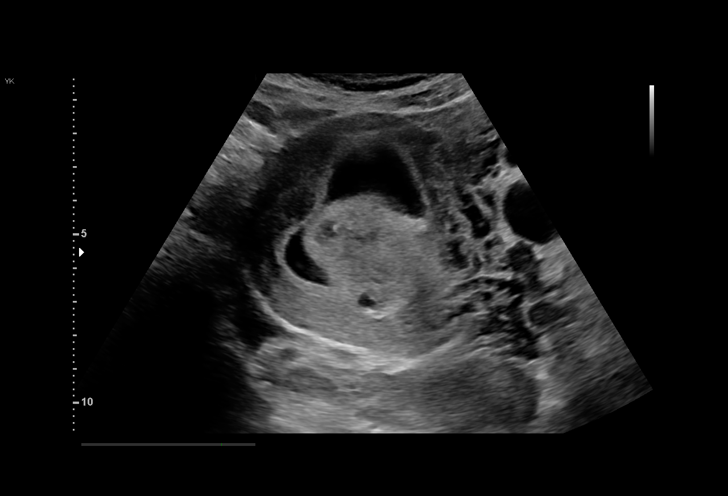
[im 11/35]
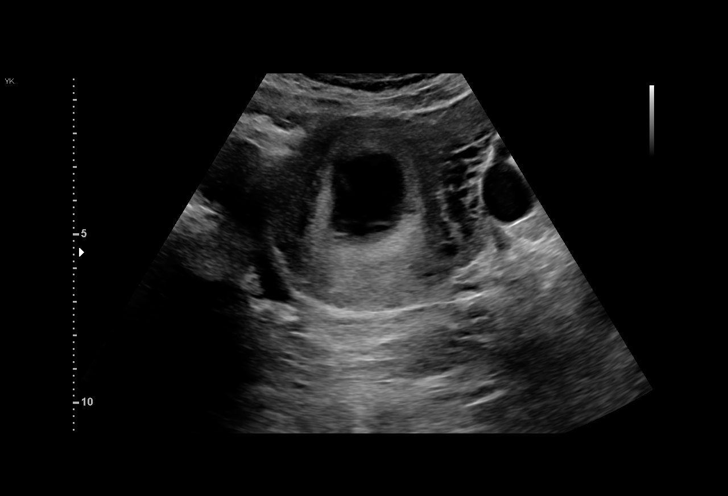
[im 13/35]
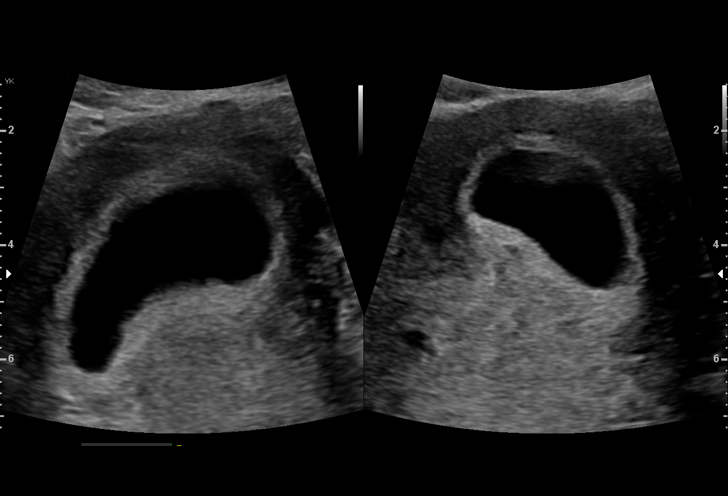
[im 16/35]
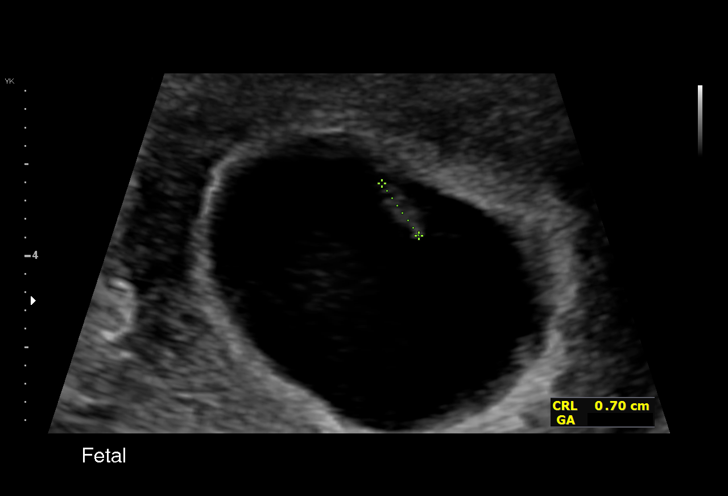
[im 18/35]
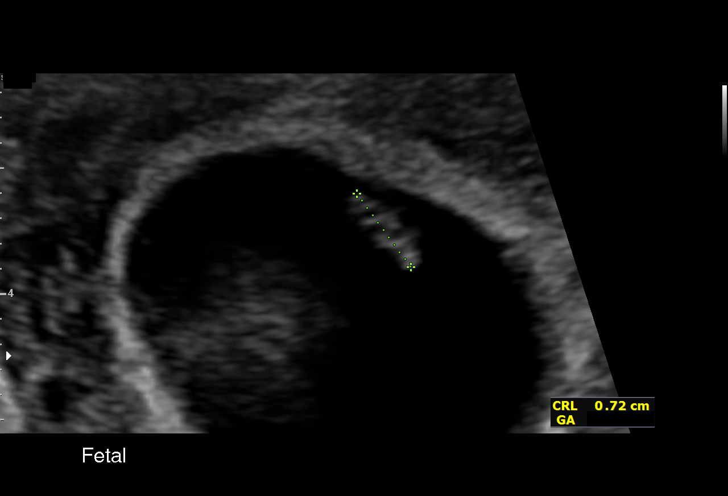
[im 19/35]
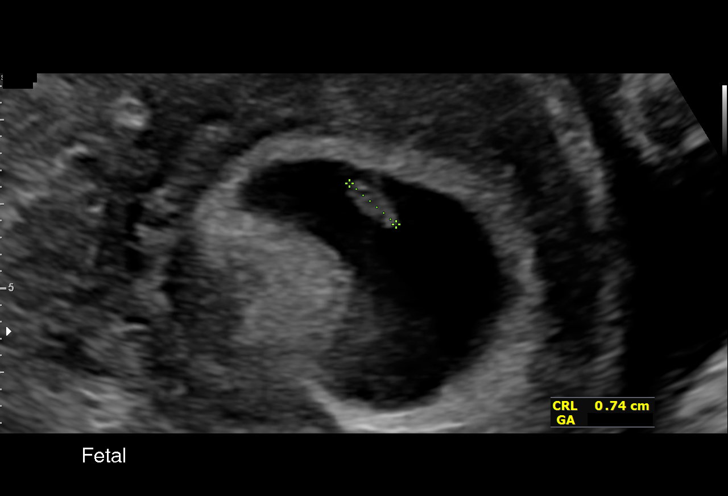
[im 22/35]
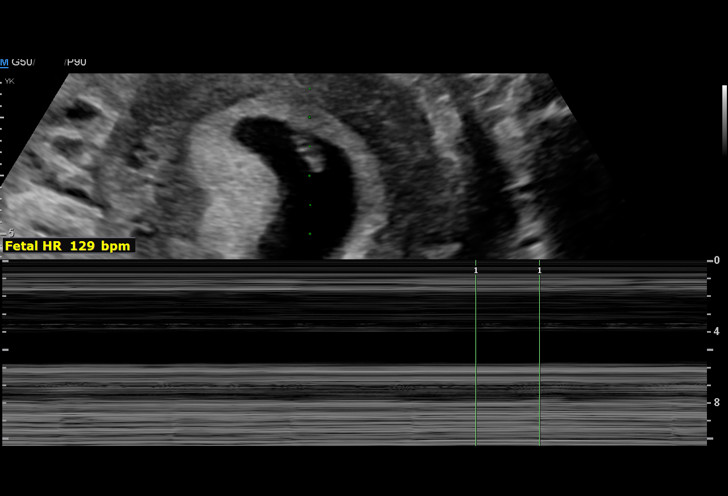
[im 24/35]
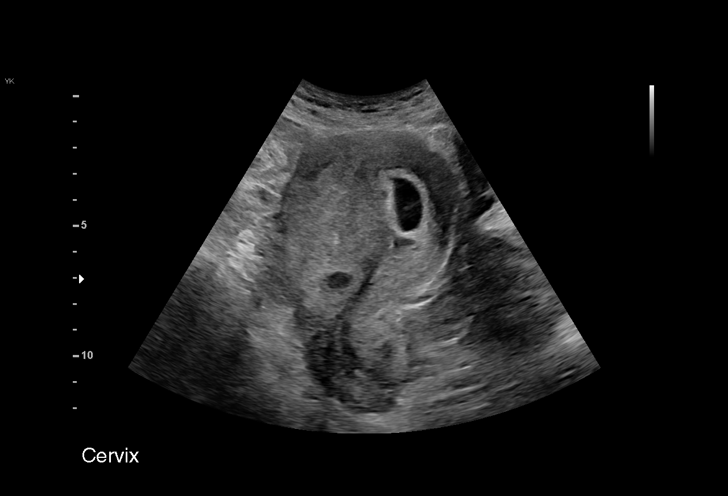
[im 27/35]
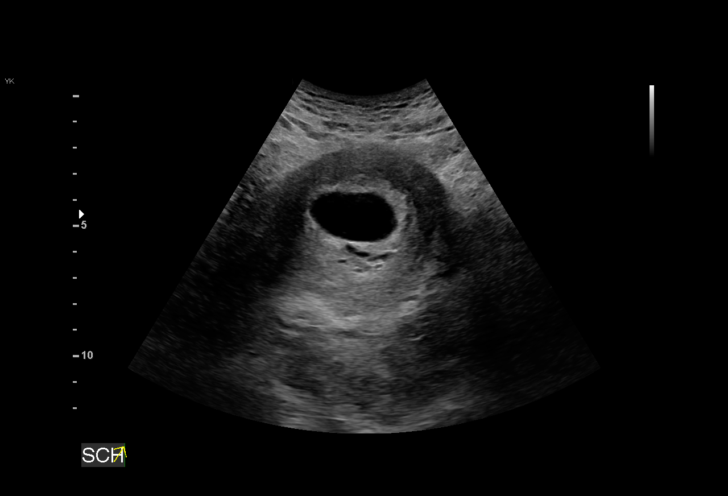
[im 29/35]
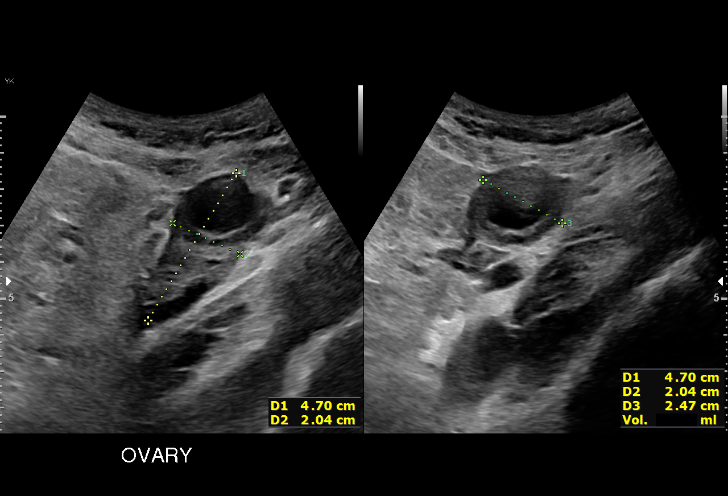
[im 32/35]
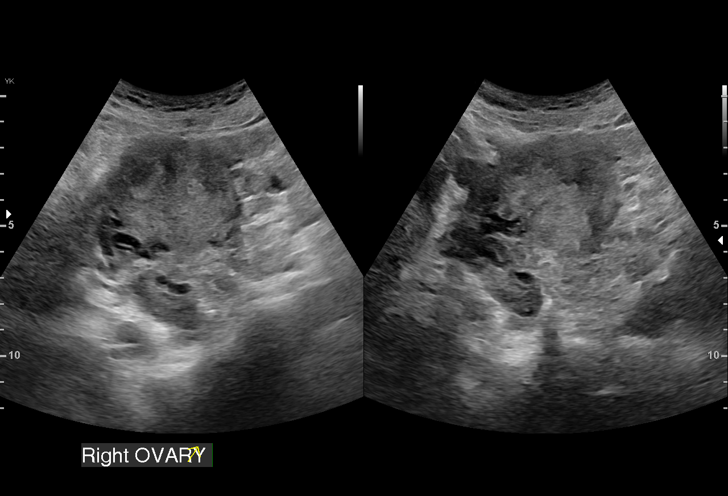
[im 35/35]
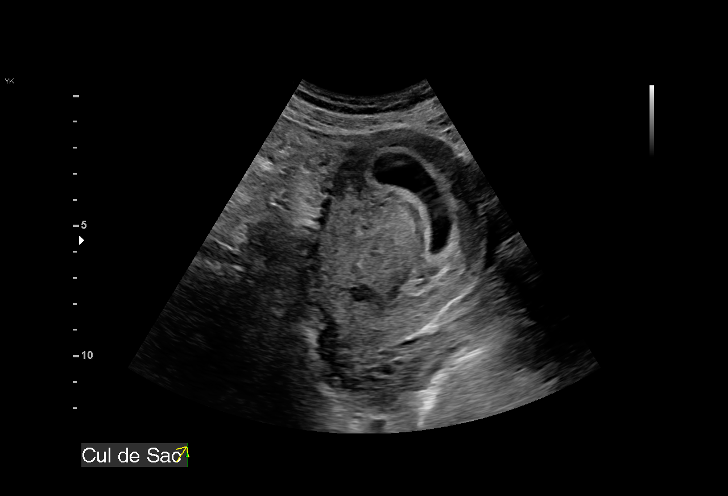

[15 of 28 positions shown; findings below may reference images not displayed]

FINDINGS: Intrauterine gestational sac: Single

Yolk sac:  Visualized.

Embryo:  Visualized.

Cardiac Activity: Visualized.

Heart Rate: 129 bpm

MSD:    mm    w     d

CRL:   7.1 mm   6 w 4 d                  US EDC: 10/01/2020

Subchorionic hemorrhage:  Small subchorionic hemorrhage.

Maternal uterus/adnexae: No adnexal mass or free fluid. Small
fibroid in posterior body measuring 1 cm.
IMPRESSION: Six week 4 day intrauterine pregnancy. Fetal heart rate 129 beats
per minute. Small subchorionic hemorrhage.
